# Patient Record
Sex: Male | Born: 1980 | Race: Black or African American | Hispanic: No | Marital: Married | Smoking: Current every day smoker
Health system: Southern US, Community
[De-identification: ages and names within clinical notes are randomized; demographics above are authoritative.]

## PROBLEM LIST (undated history)

## (undated) DIAGNOSIS — R519 Headache, unspecified: Secondary | ICD-10-CM

## (undated) HISTORY — DX: Headache, unspecified: R51.9

---

## 2010-07-04 ENCOUNTER — Emergency Department (HOSPITAL_COMMUNITY)
Admission: EM | Admit: 2010-07-04 | Discharge: 2010-07-04 | Payer: Self-pay | Source: Home / Self Care | Admitting: Emergency Medicine

## 2016-08-27 ENCOUNTER — Encounter (HOSPITAL_COMMUNITY): Payer: Self-pay | Admitting: Emergency Medicine

## 2016-08-27 ENCOUNTER — Emergency Department (HOSPITAL_COMMUNITY)
Admission: EM | Admit: 2016-08-27 | Discharge: 2016-08-27 | Disposition: A | Discharge status: Home / Self Care | Payer: Self-pay | Attending: Emergency Medicine | Admitting: Emergency Medicine

## 2016-08-27 DIAGNOSIS — Y990 Civilian activity done for income or pay: Secondary | ICD-10-CM | POA: Insufficient documentation

## 2016-08-27 DIAGNOSIS — M545 Low back pain, unspecified: Secondary | ICD-10-CM

## 2016-08-27 DIAGNOSIS — Y929 Unspecified place or not applicable: Secondary | ICD-10-CM | POA: Insufficient documentation

## 2016-08-27 DIAGNOSIS — Y939 Activity, unspecified: Secondary | ICD-10-CM | POA: Insufficient documentation

## 2016-08-27 DIAGNOSIS — X500XXA Overexertion from strenuous movement or load, initial encounter: Secondary | ICD-10-CM | POA: Insufficient documentation

## 2016-08-27 DIAGNOSIS — F17200 Nicotine dependence, unspecified, uncomplicated: Secondary | ICD-10-CM | POA: Insufficient documentation

## 2016-08-27 MED ORDER — CYCLOBENZAPRINE HCL 10 MG PO TABS: 10.0000 mg | ORAL_TABLET | Freq: Two times a day (BID) | ORAL | 0 refills | Status: DC | PRN

## 2016-08-27 NOTE — ED Provider Notes (Signed)
[facility] Provider Note   CSN: [id number] Arrival date & time: 08/27/16  1128     History   Chief Complaint Chief Complaint  Patient presents with  . Back Pain    HPI Brian Kane is a 36 y.o. male.  HPI   Residual male presents to complaints of back pain. Patient reports several years ago after lifting weights he had lower back pain that resolved without intervention. Patient notes that he works at [employer] which requires him to lift heavy objects on a regular basis. Patient notes that over the last week he's had lower back pain bilateral in the lumbar region. He denies any acute episode causing today's symptoms. He denies any involvement of the distal extremities, no red flags. Patient has not been using any pain medication at home.  History reviewed. No pertinent past medical history.  There are no active problems to display for this patient.   History reviewed. No pertinent surgical history.     Home Medications    Prior to Admission medications   Medication Sig Start Date End Date Taking? Authorizing Provider  cyclobenzaprine (FLEXERIL) 10 MG tablet Take 1 tablet (10 mg total) by mouth 2 (two) times daily as needed for muscle spasms. 08/27/16   Eyvonne MechanicJeffrey Khyli Swaim, PA-C    Family History No family history on file.  Social History Social History  Substance Use Topics  . Smoking status: Current Some Day Smoker  . Smokeless tobacco: Not on file  . Alcohol use Yes     Allergies   Patient has no known allergies.   Review of Systems Review of Systems  All other systems reviewed and are negative.    Physical Exam Updated Vital Signs BP 126/90 (BP Location: Right Arm)   Pulse 81   Temp 98.4 F (36.9 C) (Oral)   Resp 18   SpO2 100%   Physical Exam  Constitutional: He is oriented to person, place, and time. He appears well-developed and well-nourished. No distress.  HENT:  Head: Normocephalic.  Neck: Normal range of motion. Neck  supple.  Pulmonary/Chest: Effort normal.  Musculoskeletal: Normal range of motion. He exhibits tenderness. He exhibits no edema.  No C, T, or L spine tenderness to palpation. No obvious signs of trauma, deformity, infection, step-offs. Lung expansion normal. No scoliosis or kyphosis. Bilateral lower extremity strength 5 out of 5, sensation grossly intact,Straight leg negative  Moderate tenderness to palpation of the bilateral lumbar soft tissues  Ambulates without difficulty   Neurological: He is alert and oriented to person, place, and time.  Skin: Skin is warm and dry. He is not diaphoretic.  Psychiatric: He has a normal mood and affect. His behavior is normal. Judgment and thought content normal.  Nursing note and vitals reviewed.    ED Treatments / Results  Labs (all labs ordered are listed, but only abnormal results are displayed) Labs Reviewed - No data to display  EKG  EKG Interpretation None       Radiology No results found.  Procedures Procedures (including critical care time)  Medications Ordered in ED Medications - No data to display   Initial Impression / Assessment and Plan / ED Course  I have reviewed the triage vital signs and the nursing notes.  Pertinent labs & imaging results that were available during my care of the patient were reviewed by me and considered in my medical decision making (see chart for details).     36 year old male presents today with some complaints of back  pain. He will be encouraged to use ibuprofen, ice, rest. Follow-up with orthopedics if symptoms persist beyond 2 weeks, return to the emergency room if he has any new or worsening symptoms. Patient verbalized understanding and agreement to today's plan had no further questions or concerns   Final Clinical Impressions(s) / ED Diagnoses   Final diagnoses:  Acute bilateral low back pain without sciatica    New Prescriptions New Prescriptions   CYCLOBENZAPRINE (FLEXERIL) 10  MG TABLET    Take 1 tablet (10 mg total) by mouth 2 (two) times daily as needed for muscle spasms.     Eyvonne Mechanic, PA-C 08/27/16 1358    Mancel Bale, MD 08/27/16 1710

## 2016-08-27 NOTE — Discharge Instructions (Signed)
Please read attached information. If you experience any new or worsening signs or symptoms please return to the emergency room for evaluation. Please follow-up with your primary care provider or specialist as discussed. Please use medication prescribed only as directed and discontinue taking if you have any concerning signs or symptoms.   °

## 2016-08-27 NOTE — ED Notes (Signed)
Patient waiting for Dr.

## 2016-08-27 NOTE — ED Notes (Signed)
Pt unable to sign for discharge.  Pad not working. Verbalized understanding of instructions.

## 2016-08-27 NOTE — ED Triage Notes (Signed)
Pt c/o lower back pain x1 week, states 10 years ago he had a football injury to same area. States it hurts worse with exertion and bending over. Pt ambulatory.

## 2016-08-27 NOTE — ED Notes (Signed)
Pt c/o pain to mid, lower back for approx one week.  Denies any specific injury.  Pain does not radiate down legs, concerned because it is difficult for him to stand and walk due to the pain.  In no apparent distress.

## 2016-08-31 ENCOUNTER — Emergency Department (HOSPITAL_COMMUNITY)
Admission: EM | Admit: 2016-08-31 | Discharge: 2016-08-31 | Disposition: A | Discharge status: Home / Self Care | Payer: Self-pay | Attending: Emergency Medicine | Admitting: Emergency Medicine

## 2016-08-31 ENCOUNTER — Encounter (HOSPITAL_COMMUNITY): Payer: Self-pay

## 2016-08-31 DIAGNOSIS — X501XXA Overexertion from prolonged static or awkward postures, initial encounter: Secondary | ICD-10-CM | POA: Insufficient documentation

## 2016-08-31 DIAGNOSIS — Y929 Unspecified place or not applicable: Secondary | ICD-10-CM | POA: Insufficient documentation

## 2016-08-31 DIAGNOSIS — Y9389 Activity, other specified: Secondary | ICD-10-CM | POA: Insufficient documentation

## 2016-08-31 DIAGNOSIS — M545 Low back pain, unspecified: Secondary | ICD-10-CM

## 2016-08-31 DIAGNOSIS — Y990 Civilian activity done for income or pay: Secondary | ICD-10-CM | POA: Insufficient documentation

## 2016-08-31 DIAGNOSIS — F17200 Nicotine dependence, unspecified, uncomplicated: Secondary | ICD-10-CM | POA: Insufficient documentation

## 2016-08-31 MED ORDER — PREDNISONE 20 MG PO TABS: 40.0000 mg | ORAL_TABLET | Freq: Every day | ORAL | 0 refills | Status: DC

## 2016-08-31 NOTE — ED Provider Notes (Signed)
[facility] Provider Note   CSN: [id number] Arrival date & time: 08/31/16  1300   By signing my name below, I, Teofilo Pod, attest that this documentation has been prepared under the direction and in the presence of Roxy Horseman, PA-C. Electronically Signed: Teofilo Pod, ED Scribe. 08/31/2016. 2:46 PM.   History   Chief Complaint Chief Complaint  Patient presents with  . Back Pain    The history is provided by the patient. No language interpreter was used.   HPI Comments:  Brian Kane is a 36 y.o. male who presents to the Emergency Department complaining of constant lower back pain x ~2 weeks. Pt was seen here for the same 5 days ago and was prescribed flexeril that he states has not provided adequate relief. Pt frequently lifts heavy objects at work. Pt denies IV drug use. No alleviating factors noted. Pt denies bowel/bladder incontinence, fever chills. Denies any weakness, numbness, or tingling.  History reviewed. No pertinent past medical history.  There are no active problems to display for this patient.   History reviewed. No pertinent surgical history.     Home Medications    Prior to Admission medications   Medication Sig Start Date End Date Taking? Authorizing Provider  cyclobenzaprine (FLEXERIL) 10 MG tablet Take 1 tablet (10 mg total) by mouth 2 (two) times daily as needed for muscle spasms. 08/27/16   Eyvonne Mechanic, PA-C    Family History History reviewed. No pertinent family history.  Social History Social History  Substance Use Topics  . Smoking status: Current Some Day Smoker  . Smokeless tobacco: Never Used  . Alcohol use Yes     Allergies   Patient has no known allergies.   Review of Systems Review of Systems  Constitutional: Negative for chills and fever.  Musculoskeletal: Positive for back pain.     Physical Exam Updated Vital Signs BP (!) 146/105 (BP Location: Left Arm)   Pulse 83   Temp 98.9 F  (37.2 C) (Oral)   Resp 18   SpO2 100%   Physical Exam Physical Exam  Constitutional: Pt appears well-developed and well-nourished. No distress.  HENT:  Head: Normocephalic and atraumatic.  Mouth/Throat: Oropharynx is clear and moist. No oropharyngeal exudate.  Eyes: Conjunctivae are normal.  Neck: Normal range of motion. Neck supple.  No meningismus Cardiovascular: Normal rate, regular rhythm and intact distal pulses.   Pulmonary/Chest: Effort normal and breath sounds normal. No respiratory distress. Pt has no wheezes.  Abdominal: Pt exhibits no distension Musculoskeletal:  Bilateral lumbar paraspinal muscles tender to palpation, no bony CTLS spine tenderness, deformity, step-off, or crepitus Lymphadenopathy: Pt has no cervical adenopathy.  Neurological: Pt is alert and oriented Speech is clear and goal oriented, follows commands Normal 5/5 strength in upper and lower extremities bilaterally including dorsiflexion and plantar flexion, strong and equal grip strength Sensation intact Great toe extension intact Moves extremities without ataxia, coordination intact Normal gait Normal balance No Clonus Skin: Skin is warm and dry. No rash noted. Pt is not diaphoretic. No erythema.  Psychiatric: Pt has a normal mood and affect. Behavior is normal.  Nursing note and vitals reviewed.   ED Treatments / Results  DIAGNOSTIC STUDIES:  Oxygen Saturation is 100% on RA, normal by my interpretation.    COORDINATION OF CARE:  2:46 PM Will prescribe prednisone and will refer to specialist if pain continues. Discussed treatment plan with pt at bedside and pt agreed to plan.   Labs (all labs ordered are  listed, but only abnormal results are displayed) Labs Reviewed - No data to display  EKG  EKG Interpretation None       Radiology No results found.  Procedures Procedures (including critical care time)  Medications Ordered in ED Medications - No data to display   Initial  Impression / Assessment and Plan / ED Course  I have reviewed the triage vital signs and the nursing notes.  Pertinent labs & imaging results that were available during my care of the patient were reviewed by me and considered in my medical decision making (see chart for details).     Patient with back pain.  No neurological deficits and normal neuro exam.  Patient is ambulatory.  No loss of bowel or bladder control.  Doubt cauda equina.  Denies fever,  doubt epidural abscess or other lesion. Recommend back exercises, stretching, RICE, and will treat with a short course of prednisone.  Encouraged the patient that there could be a need for additional workup and/or imaging such as MRI, if the symptoms do not resolve. Patient advised that if the back pain does not resolve, or radiates, this could progress to more serious conditions and is encouraged to follow-up with PCP or orthopedics within 2 weeks.     Final Clinical Impressions(s) / ED Diagnoses   Final diagnoses:  Acute bilateral low back pain without sciatica    New Prescriptions New Prescriptions   PREDNISONE (DELTASONE) 20 MG TABLET    Take 2 tablets (40 mg total) by mouth daily.  I personally performed the services described in this documentation, which was scribed in my presence. The recorded information has been reviewed and is accurate.       Roxy HorsemanRobert Adaijah Endres, PA-C 08/31/16 1503    Lavera Guiseana Duo Liu, MD 09/01/16 [phone]

## 2016-08-31 NOTE — ED Triage Notes (Signed)
Pt reports lower back pain. He has been seen for same but reports he is still having pain. He reports the pain is worse after sitting for long periods. Pt ambulatory without difficulty.

## 2017-09-18 ENCOUNTER — Ambulatory Visit (HOSPITAL_COMMUNITY): Payer: Self-pay | Admitting: Psychology

## 2017-09-18 ENCOUNTER — Ambulatory Visit (HOSPITAL_COMMUNITY)
Admission: RE | Admit: 2017-09-18 | Discharge: 2017-09-18 | Disposition: A | Discharge status: Home / Self Care | Payer: 59 | Attending: Psychiatry | Admitting: Psychiatry

## 2017-09-18 ENCOUNTER — Telehealth (HOSPITAL_COMMUNITY): Payer: Self-pay | Admitting: Psychology

## 2017-09-18 NOTE — [facility] Assessment (Signed)
[facility] Assessment Progress Note  Pt came in as a [facility] walk in for detox at 1600. After waiting in the lobby for awhile, pt realized that [facility].   Brian ShockSamantha M. Ladona Ridgelaylor, MS, NCC, LPCA Counselor

## 2017-09-19 ENCOUNTER — Ambulatory Visit (INDEPENDENT_AMBULATORY_CARE_PROVIDER_SITE_OTHER): Payer: 59 | Admitting: Psychology

## 2017-09-19 ENCOUNTER — Encounter (HOSPITAL_COMMUNITY): Payer: Self-pay | Admitting: Psychology

## 2017-09-19 DIAGNOSIS — F1020 Alcohol dependence, uncomplicated: Secondary | ICD-10-CM
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
## 2017-10-06 ENCOUNTER — Encounter (HOSPITAL_COMMUNITY): Payer: Self-pay | Admitting: Psychology
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
## 2017-10-07 NOTE — Addendum Note (Signed)
Addended byLogan Bores: Mailani Degroote on: 10/07/2017 02:44 PM   Modules accepted: Level of Service

## 2017-10-09 ENCOUNTER — Other Ambulatory Visit (HOSPITAL_COMMUNITY): Payer: 59 | Attending: Psychiatry | Admitting: Psychology

## 2017-10-09 ENCOUNTER — Encounter (HOSPITAL_COMMUNITY): Payer: Self-pay | Admitting: Medical

## 2017-10-09 VITALS — BP 132/90 | HR 81 | Ht 72.0 in | Wt 261.6 lb

## 2017-10-09 DIAGNOSIS — F1211 Cannabis abuse, in remission: Secondary | ICD-10-CM

## 2017-10-09 DIAGNOSIS — Z62812 Personal history of neglect in childhood: Secondary | ICD-10-CM

## 2017-10-09 DIAGNOSIS — F4312 Post-traumatic stress disorder, chronic: Secondary | ICD-10-CM

## 2017-10-09 DIAGNOSIS — F1020 Alcohol dependence, uncomplicated: Secondary | ICD-10-CM

## 2017-10-09 DIAGNOSIS — Z811 Family history of alcohol abuse and dependence: Secondary | ICD-10-CM | POA: Insufficient documentation

## 2017-10-09 DIAGNOSIS — G44011 Episodic cluster headache, intractable: Secondary | ICD-10-CM

## 2017-10-09 DIAGNOSIS — Z6372 Alcoholism and drug addiction in family: Secondary | ICD-10-CM | POA: Insufficient documentation

## 2017-10-09 DIAGNOSIS — Z9189 Other specified personal risk factors, not elsewhere classified: Secondary | ICD-10-CM

## 2017-10-09 DIAGNOSIS — G44001 Cluster headache syndrome, unspecified, intractable: Secondary | ICD-10-CM | POA: Insufficient documentation

## 2017-10-09 MED ORDER — BACLOFEN 10 MG PO TABS: 10.0000 mg | ORAL_TABLET | Freq: Three times a day (TID) | ORAL | 11 refills | Status: AC

## 2017-10-09 MED ORDER — SERTRALINE HCL 25 MG PO TABS: ORAL_TABLET | ORAL | 0 refills | Status: DC

## 2017-10-09 MED ORDER — B COMPLEX VITAMINS PO CAPS
1.0000 | ORAL_CAPSULE | Freq: Every day | ORAL | 2 refills | Status: AC
Start: 2017-10-09 — End: 2018-01-07
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
## 2017-10-11 ENCOUNTER — Other Ambulatory Visit (HOSPITAL_COMMUNITY): Payer: 59

## 2017-10-11 ENCOUNTER — Encounter (HOSPITAL_COMMUNITY): Payer: Self-pay
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
## 2017-10-12 ENCOUNTER — Other Ambulatory Visit (HOSPITAL_COMMUNITY): Payer: 59

## 2017-10-15 ENCOUNTER — Telehealth (HOSPITAL_COMMUNITY): Payer: Self-pay | Admitting: Psychology

## 2017-10-16 ENCOUNTER — Other Ambulatory Visit (HOSPITAL_COMMUNITY): Payer: 59 | Attending: Psychiatry | Admitting: Psychology

## 2017-10-16 ENCOUNTER — Telehealth (HOSPITAL_COMMUNITY): Payer: Self-pay | Admitting: Psychology

## 2017-10-16 DIAGNOSIS — F4312 Post-traumatic stress disorder, chronic: Secondary | ICD-10-CM | POA: Diagnosis present

## 2017-10-16 DIAGNOSIS — Z811 Family history of alcohol abuse and dependence: Secondary | ICD-10-CM | POA: Insufficient documentation

## 2017-10-16 DIAGNOSIS — F1020 Alcohol dependence, uncomplicated: Secondary | ICD-10-CM | POA: Insufficient documentation

## 2017-10-16 DIAGNOSIS — G44001 Cluster headache syndrome, unspecified, intractable: Secondary | ICD-10-CM | POA: Diagnosis not present

## 2017-10-16 DIAGNOSIS — F1211 Cannabis abuse, in remission: Secondary | ICD-10-CM | POA: Diagnosis not present

## 2017-10-17 ENCOUNTER — Encounter (HOSPITAL_COMMUNITY): Payer: Self-pay | Admitting: Psychology

## 2017-10-17 NOTE — Progress Notes (Signed)
    Daily Group Progress Note  Program: CD-IOP   10/17/2017 Brian Kane [id number]  Diagnosis:  Alcohol use disorder, severe, dependence ([facility])   Sobriety Date: 3/11  Group Time: 1-2:30  Participation Level: Active  Behavioral Response: Appropriate and Sharing  Type of Therapy: Process Group  Interventions: CBT, Motivational Interviewing, Strength-based and Supportive  Topic: Patients were active and engaged in process session. Counselors led pt's in discussion concerning their recovery from mind-altering drugs. Emphasis on goals from tx plan and thoughts and feelings processing. UDS samples were collected from some members. One returning member was back after missing a group due to serving some jail time. One member graduated successfully and was able to listen to group feedback w/o using humor as a defense mechanism for avoiding feelings of self-worth.      Group Time: 2:30-4  Participation Level: Active  Behavioral Response: Appropriate and Sharing  Type of Therapy: Psycho-education Group  Interventions: Other: Chair Yoga  Topic: Patients were active and engaged in group psychoeducation session. A guest counselor, Forde RadonLeanne Yates [facility] was present and led 1.5 hr Chair Yoga routine for increased peace and mindfulness. Pts were attentive and shared openly during session. Pts were invited to share about their experience of the yoga routine w/ the group.     Summary: Pt active and engaged in session. Pt was reminded that he needs to establish better connection w/ counselors and this program since there were multiple attempts to contact him this past weekend, w/o a response. Pt apologized and stated he is planning on continuing in the program, though he will have to serve jail time 15 weekends in a row starting this weekend. Pt was emotionless when disclosing this to group. The group encouraged him to focus on his sobriety in the midst of the struggle. Pt reported he has  been focused on moving into a new house by himself. Pt reported he struggled w/ depression this weekend which caused him to crave alcohol. Pt stated he did get his medications filled and has begun taking them as px. He was active and engaged during yoga session though he admitted he had never done any yoga before. Pt stated he felt more relaxed after yoga session. A UDS sample was collected from member. Pt reported he did not attend any 12 step meetings over weekend.   UDS collected: Yes Results: pending  AA/NA attended?: No  Sponsor?: No   Brian Kane, LPCA LCASA 10/17/2017 5:18 PM

## 2017-10-18 ENCOUNTER — Other Ambulatory Visit (INDEPENDENT_AMBULATORY_CARE_PROVIDER_SITE_OTHER): Payer: 59 | Admitting: Psychology

## 2017-10-18 DIAGNOSIS — F4312 Post-traumatic stress disorder, chronic: Secondary | ICD-10-CM | POA: Diagnosis not present

## 2017-10-18 DIAGNOSIS — Z6372 Alcoholism and drug addiction in family: Secondary | ICD-10-CM

## 2017-10-18 DIAGNOSIS — G44011 Episodic cluster headache, intractable: Secondary | ICD-10-CM

## 2017-10-18 DIAGNOSIS — Z62812 Personal history of neglect in childhood: Secondary | ICD-10-CM

## 2017-10-18 DIAGNOSIS — F1020 Alcohol dependence, uncomplicated: Secondary | ICD-10-CM | POA: Diagnosis not present

## 2017-10-18 DIAGNOSIS — Z9189 Other specified personal risk factors, not elsewhere classified: Secondary | ICD-10-CM | POA: Diagnosis not present

## 2017-10-18 DIAGNOSIS — F1211 Cannabis abuse, in remission: Secondary | ICD-10-CM

## 2017-10-18 DIAGNOSIS — Z811 Family history of alcohol abuse and dependence: Secondary | ICD-10-CM
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
## 2017-10-19 ENCOUNTER — Other Ambulatory Visit (HOSPITAL_COMMUNITY): Payer: 59 | Admitting: Psychology

## 2017-10-19 ENCOUNTER — Encounter (HOSPITAL_COMMUNITY): Payer: Self-pay | Admitting: Medical

## 2017-10-19 DIAGNOSIS — F1020 Alcohol dependence, uncomplicated: Secondary | ICD-10-CM

## 2017-10-19 DIAGNOSIS — F4312 Post-traumatic stress disorder, chronic: Secondary | ICD-10-CM
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
## 2017-10-23 ENCOUNTER — Telehealth (HOSPITAL_COMMUNITY): Payer: Self-pay | Admitting: Psychology

## 2017-10-23 ENCOUNTER — Other Ambulatory Visit (HOSPITAL_COMMUNITY): Payer: 59

## 2017-10-25 ENCOUNTER — Other Ambulatory Visit (INDEPENDENT_AMBULATORY_CARE_PROVIDER_SITE_OTHER): Payer: 59 | Admitting: Psychology

## 2017-10-25 ENCOUNTER — Encounter (HOSPITAL_COMMUNITY): Payer: Self-pay | Admitting: Medical

## 2017-10-25 DIAGNOSIS — G44011 Episodic cluster headache, intractable: Secondary | ICD-10-CM | POA: Diagnosis not present

## 2017-10-25 DIAGNOSIS — Z62812 Personal history of neglect in childhood: Secondary | ICD-10-CM | POA: Diagnosis not present

## 2017-10-25 DIAGNOSIS — Z9189 Other specified personal risk factors, not elsewhere classified: Secondary | ICD-10-CM

## 2017-10-25 DIAGNOSIS — F1020 Alcohol dependence, uncomplicated: Secondary | ICD-10-CM | POA: Diagnosis not present

## 2017-10-25 DIAGNOSIS — Z6372 Alcoholism and drug addiction in family: Secondary | ICD-10-CM

## 2017-10-25 DIAGNOSIS — F4312 Post-traumatic stress disorder, chronic: Secondary | ICD-10-CM

## 2017-10-25 DIAGNOSIS — Z9119 Patient's noncompliance with other medical treatment and regimen: Secondary | ICD-10-CM | POA: Diagnosis not present

## 2017-10-25 DIAGNOSIS — Z91199 Patient's noncompliance with other medical treatment and regimen due to unspecified reason: Secondary | ICD-10-CM

## 2017-10-25 DIAGNOSIS — F1211 Cannabis abuse, in remission: Secondary | ICD-10-CM | POA: Diagnosis not present

## 2017-10-25 DIAGNOSIS — Z811 Family history of alcohol abuse and dependence: Secondary | ICD-10-CM

## 2017-10-25 MED ORDER — TRAZODONE HCL 50 MG PO TABS: 50.0000 mg | ORAL_TABLET | Freq: Every evening | ORAL | 2 refills | Status: DC | PRN

## 2017-10-25 NOTE — Progress Notes (Addendum)
Patient ID: Brian Kane, male   DOB: 01/22/1981, 37 y.o.   MRN: [id number] S&O Pt seen after failure to appear for 2 groups and failure to call and UDS + for alcohol 10/16/2017.[name]. At team meeting it was agreed to discharge patient to higher level of care. [name] demonstrates remorse for his failure to act responsibly at [age] yrs of age. His use of alcohol was triggered by discovery of some White liquor while cleaning and packing to move.  His absences were the result of his legal entanglements around his DUI s.he had agreed to serve weekends and upon release from weekend jail he noticed the charge on his paperwork was not what he had agreed to The charf ge was more serios and required 1-2 yr incarceration. He spent the next few days going between Court and Lawyer-obsessed with fear and trying to get the charge reduced on his paper work.   He repeats "I 've got a lot on my plate" and then reveals his relationship is a source of stress and "makes me want to drink" .[name]. He says he needs to end it .  Requests Trazodone  A- Naive to treatment and just beginning first attempt-failures do not indicate lack of motivation for treatment but rather lack of skills and understanding  P- Pt is informed that he will not be discharged but that he is at "Zero tolerance" for repeat absences and use. He is given a slogan "First Things First" to use when he feels overwhelmed to help keep priorities straight. He is reminded of mindfulness principle that h is alright in the present moment and can focus his attention there rather than on worries he can do nothing about right now. He is instructed to do what he can now/today about his situation and then to"let go" of what he cannot. He is asked to compare 8 weekends (16 days) to a lifetime of addiction and its consequences.he is informed that what becomes more important than his sobriety/recovery will be the first thing he will lose to his addiction/relapse Finally he is  asked to pray about these things and returned to Group

## 2017-10-25 NOTE — Addendum Note (Signed)
Addended by: Court JoyKOBER, Jevon Shells E on: 10/25/2017 04:07 PM   Modules accepted: Orders

## 2017-10-26 ENCOUNTER — Other Ambulatory Visit (HOSPITAL_COMMUNITY): Payer: 59 | Admitting: Psychology

## 2017-10-26 DIAGNOSIS — F1020 Alcohol dependence, uncomplicated: Secondary | ICD-10-CM

## 2017-10-27 ENCOUNTER — Encounter (HOSPITAL_COMMUNITY): Payer: Self-pay | Admitting: Psychology

## 2017-10-27 NOTE — Progress Notes (Signed)
    Daily Group Progress Note  Program: CD-IOP   10/27/2017 Brian Kane [id number]  Diagnosis: F10.20  Sobriety Date: 10/14/2017  Group Time: 1-2:30pm  Participation Level: Active  Behavioral Response: Appropriate  Type of Therapy: Process Group  Interventions: Supportive  Topic: Patients were active and engaged in process session.  Patients shared about coping skills that they have been using to combat triggers and thoughts of using.  Patients shared about their experiences in 12-step recovery meetings with group members who have yet to attend meetings yet.  Patients drew popsicle sticks with different recovery-related topics written on them and spoke about how they could relate to their chosen topic.  Patients welcomed a new group member.  Counselors collected three UDS.   Group Time: 2:30-4pm  Participation Level: Active  Behavioral Response: Appropriate  Type of Therapy: Psycho-education Group  Interventions: Strength-based and Psychologist, occupationalocial Skills Training  Topic: Patients were active and engaged in psychoeducation session, during which counselors led education and discussion around [employer]'s "Five Love Languages".  The Five Languages are five ways that people tend to give/receive love and include words of affirmation, receiving gifts, acts of service, physical touch and quality time.  Patients took an assessment to determine how much they value each love language.  Patients were then encouraged to think about how they prefer to experience love and how their loved ones' love languages may be similar or different from their own.  Patients responded well to psychoeducation and engaged in active discussion.   Summary: Patient was engaged in session.  Patient said he did a "self-check" after group the previous day and recognized that he needs to be honest with himself and others.  Patient went to the 10 AM men's AA meeting that morning and felt welcomed and received a  starter chip.  Patient inquired about what it takes to become a home group member in [place] and said he received about 15 phone numbers from other men in the fellowship.  Patient said he has to write a 3-page paper before he goes back to jail this weekend and has set up a meeting with [employer].  In the popsicle stick activity, patient spoke about "service in the fellowship" and expressed that he looks forward to attending more AA meetings and becoming more involved.  Patient identified words of affirmation and quality time as his top two love languages.   UDS collected: No  AA/NA attended?: YesThursday  Sponsor?: No   Charmian Muffnn Tiffney Haughton, LCAS 10/27/2017 3:03 PM

## 2017-10-27 NOTE — Progress Notes (Signed)
    Daily Group Progress Note  Program: CD-IOP  10/19/2017 Ermelinda Dasmanuel R Gwyn [id number]  Diagnosis: F10.20  Sobriety Date: 09/25/2017  Group Time: 1-2:30pm  Participation Level: Active  Behavioral Response: Appropriate  Type of Therapy: Process Group  Interventions: Supportive  Topic: Patients were active and engaged in process session.  Patients shared accomplishments and challenges with relationships and recovery.  Patients shared about their experiences of recovery meetings and the progression of addiction.  Patients provided one another with feedback surrounding the course of addiction and how they have come to identify themselves as addicts or alcoholics.   Group Time: 2:30-4pm  Participation Level: Active  Behavioral Response: Appropriate  Type of Therapy: Psycho-education Group  Interventions: Family Systems  Topic: Patients were active and engaged in psychoeducation session, in which counselors facilitated continued discussion around family through family sculpting.  Several more patients sculpted their families, using other group members to represent their family members, including themselves.  Patients responded well to psychoeducation and engaged in active discussion.   Summary: Patient was engaged and quiet in session.  Patient said he wanted to go to a meeting the previous day but had no way to get there, because he had cash but no credit on his account to use [employer].  Patient said he thought about illegally driving there but decided against it, as he saw a Parole Officer's car.  Patient shared about his upcoming move and said he spent time working on his mower.  Patient related to other group members who felt like they were supposed to be the "man of the house" when their fathers left.   UDS collected: No   AA/NA attended?: No  Sponsor?: No   Charmian Muffnn Jhalil Silvera, LCAS 10/27/2017 2:32 PM

## 2017-10-30 ENCOUNTER — Other Ambulatory Visit (HOSPITAL_COMMUNITY): Payer: 59 | Admitting: Psychology

## 2017-10-30 ENCOUNTER — Encounter (HOSPITAL_COMMUNITY): Payer: Self-pay | Admitting: Psychology

## 2017-10-30 DIAGNOSIS — F1020 Alcohol dependence, uncomplicated: Secondary | ICD-10-CM
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
## 2017-10-31 ENCOUNTER — Encounter (HOSPITAL_COMMUNITY): Payer: Self-pay | Admitting: Psychology
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
## 2017-11-01 ENCOUNTER — Other Ambulatory Visit (HOSPITAL_COMMUNITY): Payer: 59 | Admitting: Psychology

## 2017-11-01 DIAGNOSIS — F1020 Alcohol dependence, uncomplicated: Secondary | ICD-10-CM | POA: Diagnosis not present

## 2017-11-02 ENCOUNTER — Other Ambulatory Visit (HOSPITAL_COMMUNITY): Payer: 59 | Admitting: Psychology

## 2017-11-02 DIAGNOSIS — F1020 Alcohol dependence, uncomplicated: Secondary | ICD-10-CM

## 2017-11-02 DIAGNOSIS — F4312 Post-traumatic stress disorder, chronic: Secondary | ICD-10-CM

## 2017-11-06 ENCOUNTER — Other Ambulatory Visit (HOSPITAL_COMMUNITY): Payer: 59 | Admitting: Psychology

## 2017-11-06 ENCOUNTER — Other Ambulatory Visit (HOSPITAL_COMMUNITY): Payer: Self-pay | Admitting: Medical

## 2017-11-06 ENCOUNTER — Encounter (HOSPITAL_COMMUNITY): Payer: Self-pay | Admitting: Medical

## 2017-11-06 DIAGNOSIS — F1211 Cannabis abuse, in remission: Secondary | ICD-10-CM

## 2017-11-06 DIAGNOSIS — Z62812 Personal history of neglect in childhood: Secondary | ICD-10-CM

## 2017-11-06 DIAGNOSIS — Z9189 Other specified personal risk factors, not elsewhere classified: Secondary | ICD-10-CM

## 2017-11-06 DIAGNOSIS — F1020 Alcohol dependence, uncomplicated: Secondary | ICD-10-CM | POA: Diagnosis not present

## 2017-11-06 DIAGNOSIS — F4312 Post-traumatic stress disorder, chronic: Secondary | ICD-10-CM

## 2017-11-06 DIAGNOSIS — G44011 Episodic cluster headache, intractable: Secondary | ICD-10-CM

## 2017-11-06 DIAGNOSIS — Z6372 Alcoholism and drug addiction in family: Secondary | ICD-10-CM

## 2017-11-06 DIAGNOSIS — Z811 Family history of alcohol abuse and dependence: Secondary | ICD-10-CM

## 2017-11-06 MED ORDER — SERTRALINE HCL 100 MG PO TABS: ORAL_TABLET | ORAL | 1 refills | Status: DC
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
## 2017-11-07 ENCOUNTER — Encounter (HOSPITAL_COMMUNITY): Payer: Self-pay | Admitting: Psychology

## 2017-11-07 NOTE — Progress Notes (Signed)
    Daily Group Progress Note  Program: CD-IOP   11/01/2017 Brian Kane [id number]  Diagnosis: F10.20  Sobriety Date: 3/30  Group Time: 1-2:30pm  Participation Level: Active  Behavioral Response: Appropriate  Type of Therapy: Process Group  Interventions: Supportive  Topic: Patients were active and engaged in process session.  Patients shared about challenges and successes in recovery and relationships.  Patients provided one another with feedback and shared common experiences.  Patients discussed recovery-based meetings, triggers and ways that they have developed routines to promote their sobriety and recovery.  Medical director met with several patients.   Group Time: 2:30-4pm  Participation Level: Active  Behavioral Response: Appropriate  Type of Therapy: Psycho-education Group  Interventions: Assertiveness Training  Topic: Patients were active and engaged in psychoeducation session, during which counselors continued leading discussion around the topic of communication.  Patients identified ways that they had communicated throughout the week and what type of communication they used (passive, aggressive passive-aggressive, or assertive).  Counselors taught more about assertive communication and how to use the pattern "When you _____, I feel ______" to communicate effectively and take responsibility for one's feelings.  Patients engaged in role plays and discussed current conflicts in their lives, while using the "Feelings Wheel" to name emotions that they were experiencing.  Patients responded well to psychoeducation and engaged in active discussion.   Summary: Patient was engaged in session.  Patient said he helped his mother move into her own place on Monday and went to court on Tuesday.  Patient said he will hopefully be able to choose the weekends that he serves his jail time, if he works it out with his Engineer, manufacturing systems.  Patient said he called around 34  lawyers to try to have someone to represent him in court that day and ended up running into a lawyer and making a deal on the spot.  Patient said he emailed Clinical biochemist of the [place] [employer] about his issue with his professor and was directed to email his professor again, so he is waiting to hear back.  Patient said he is waiting for his company to figure out his payroll, so that he can go for his DWI assessment.  Patient said he used aggressive communication on break during group, because his supervisor and his former supervisor contacted him about a work situation.  Patient said he felt angry, frustrated and furious after the call, because he felt that his coworkers were accusing him, which has happened before.  Counselors validated patient's feelings and that patient returned to group instead of showing up at his workplace angry.   UDS collected: No Results: negative  AA/NA attended?: Yes Wednesday  Sponsor?: No   [name], LCAS 11/07/2017 11:27 AM

## 2017-11-08 ENCOUNTER — Other Ambulatory Visit (HOSPITAL_COMMUNITY): Payer: 59 | Admitting: Psychology

## 2017-11-08 DIAGNOSIS — F1020 Alcohol dependence, uncomplicated: Secondary | ICD-10-CM

## 2017-11-09 ENCOUNTER — Other Ambulatory Visit (HOSPITAL_COMMUNITY): Payer: 59 | Admitting: Psychology

## 2017-11-13 ENCOUNTER — Encounter (HOSPITAL_COMMUNITY): Payer: Self-pay | Admitting: Psychology

## 2017-11-13 ENCOUNTER — Other Ambulatory Visit (HOSPITAL_COMMUNITY): Payer: 59 | Admitting: Psychology

## 2017-11-13 DIAGNOSIS — F1020 Alcohol dependence, uncomplicated: Secondary | ICD-10-CM

## 2017-11-13 NOTE — Progress Notes (Signed)
    Daily Group Progress Note  Program: CD-IOP   11/13/2017 Brian Kane [id number]  Diagnosis:  Alcohol use disorder, severe, dependence ([facility])  Chronic post-traumatic stress disorder (PTSD)   Sobriety Date:   Group Time: 1-2:30  Participation Level: Active  Behavioral Response: Appropriate and Sharing  Type of Therapy: Process Group  Interventions: CBT and Psychosocial Skills: ADL's and Sleep Hygiene  Topic: Patients were active and engaged in process session. Counselors led pt's in discussion concerning their recovery from mind-altering drugs. Emphasis on goals from tx plan and thoughts and feelings processing. 1 Pt graduated successfully and family memers were present for final 15 min of group. UDS results were returned to some members.      Group Time: 2:30-4  Participation Level: Active  Behavioral Response: Appropriate and Sharing  Type of Therapy: Psycho-education Group  Interventions: Psychosocial Skills: ADL's and Sleep Hygeine  Topic: Patients were active and engaged in group psychoeducation session. Duncan Dull, Wellness Director and health educator spoke to group for 1 hour on activities of daily living, w/ an emphasis on improving sleep. A powerpoint was presented. Pts asked questions and some asked for help on improving their sleep habits.     Summary: Pt is active and engaged in session. He reported he did not attend AA since yesterday. He shared openly about his problematic sleep. He denies active cravings or thoughts of using. Pt continues to report increased insight into his passive communication.    UDS collected: No Results: negative  AA/NA attended?: No  Sponsor?: No   Wes Hjalmar Ballengee, LPCA LCASA 11/13/2017 9:51 AM

## 2017-11-13 NOTE — Progress Notes (Signed)
    Daily Group Progress Note  Program: CD-IOP   11/13/2017 RUBERT FREDIANI [id number]  Diagnosis: F10.20 Alcohol use disorder, severe  Sobriety Date: 10/15/17  Group Time: 1-2:30pm  Participation Level: Active  Behavioral Response: Sharing  Type of Therapy: Process Group  Interventions: Supportive  Topic: Process: The first half of group was spent in process. Members shared the events of the past two days and identified any challenges or speed bumps that came their way. A new group member was present and he introduced himself during this half of group. Early in the session, one of the group members reported she was not feeling well. She was accompanied to the nurse's office and did not return to group. Three drug tests were collected. The medical director met with one member to review her discharge plan along with the new group member.  Group Time: 2:30-4pm  Participation Level: Active  Behavioral Response: Appropriate and Sharing  Type of Therapy: Psycho-education Group  Interventions: CBT  Topic: Chaplain: Feelings. The second half of group was spent in a psycho-ed with the visiting chaplain. After introductions, including having each member identify what they were feeling at that moment, the discussion focused on feelings. He explained a radical perspective that revolved around the notion that when we experience certain feelings, it is due largely to previous experiences and judgements and not necessarily to the immediate experience one is having. The idea that each of [place] is entirely responsible for what we are feeling was discussed at length. Members were invited to challenge old ways of thinking and embrace the possibilities of a different way of living.   Summary: The patient reported he had purchased a new lawn mower and completed three lawns yesterday. 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 He also put in a few applications. They were for a  position to maintain mechanical and industrial machinery. He reported he also attended his [employer] class last night. The semester is coming to an end and he is hoping he will pass the class. When asked about attending meetings, he admitted he had not gone to any since we last met. He was quick to point out that, "I have nothing against meetings, but I just haven't made time". The counselor reminded him that attending four recovery-based meetings per week is mandatory in order to be in this program. The patient assured her that he understood this. In the psycho-ed, the patient identified the feeling of 'accepted' and explained that he felt comforted knowing "I am not the only one dealing with this disease". He was attentive and engaged in the discussion on feelings and the concept that he is responsible for what he is feeling and no one else. The patient provided helpful feedback and he responded well to this intervention. The patient is a good group member and very quick to share his stuff, however, if he does not pick up his attendance in 12-step meetings, he will not be allowed to remain in the program.    UDS collected: No Results:   AA/NA attended?: No  Sponsor?: No   [name], [place] 11/13/2017 9:31 AM

## 2017-11-14 ENCOUNTER — Encounter (HOSPITAL_COMMUNITY): Payer: Self-pay | Admitting: Psychology
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
## 2017-11-15 ENCOUNTER — Encounter (HOSPITAL_COMMUNITY): Payer: Self-pay | Admitting: Psychology

## 2017-11-15 ENCOUNTER — Other Ambulatory Visit (HOSPITAL_COMMUNITY): Payer: 59 | Attending: Psychiatry | Admitting: Psychology

## 2017-11-15 DIAGNOSIS — F4312 Post-traumatic stress disorder, chronic: Secondary | ICD-10-CM | POA: Insufficient documentation

## 2017-11-15 DIAGNOSIS — F1020 Alcohol dependence, uncomplicated: Secondary | ICD-10-CM | POA: Insufficient documentation

## 2017-11-15 NOTE — Progress Notes (Signed)
    Daily Group Progress Note  Program: CD-IOP   11/15/2017 Brian Kane [id number]  Diagnosis: F10.20 Alcohol Use Disorder  Sobriety Date: 10/15/17  Group Time: 1-2:30pm  Participation Level: Active  Behavioral Response: Sharing  Type of Therapy: Process Group  Interventions: Supportive  Topic: Process: the first half of group was spent in process. Members shared about the past weekend. they identified 'speed bumps' or challenges as well as successes they may have had. A new group member was present today and she introduced herself during this half of group. The medical director met with a member for a med check. Three random drug tests were collected.  Group Time: 2:30-4pm  Participation Level: Active  Behavioral Response: Sharing  Type of Therapy: Psycho-education Group  Interventions: Psychosocial Skills: Change your thoughts, attitudes and behaviors  Topic: Psycho-Ed: Bio-Psycho-Social-Spiritual. The second half of group was a psycho-ed on the four major elements that contribute to the development of addiction. Group members were asked to identify examples of ways that their psychological, social and spiritual experiences now, and in childhood, may have contributed to their addiction. They were the asked to identify healthy ways that they can address these three elements that will enhance or promote sobriety. The session was lively with good disclosure and the newest group member seemed very comfortable and well-received.   Summary: The patient reported he had enjoyed the weekend and had not worked or gone to jail. He had spent time with a woman he has been seeing. He has been sharing what he has been learning in group with her and it is enhancing their relationship. They went bicycling at [facility] on Sunday and the patient admitted it was challenging and hard to bike up those trails. He was funny as he shared his experience of a '[employer]' bicycle compared to the  serious bikers that were flying past him. He realized he might need to get some new equipment. The patient reported his Engineer, manufacturing systems phoned him earlier today and stated he would be coming by for a home visit sometime this evening, between 5-9:30 pm. The patient didn't like this long period of waiting and expressed frustration over this. In the final part of group today, the patient identified his word as 'Gratitude'. He expressed that he was grateful for 'all life's lessons". He responded well to this intervention. A drug test was collected from this patient today.   UDS collected: Yes Results:pending  AA/NA attended?: YesMonday and Saturday  Sponsor?: No   Brian Kane, LCAS 11/15/2017 8:38 AM

## 2017-11-16 ENCOUNTER — Other Ambulatory Visit (HOSPITAL_COMMUNITY): Payer: 59 | Admitting: Psychology

## 2017-11-16 DIAGNOSIS — F4312 Post-traumatic stress disorder, chronic: Secondary | ICD-10-CM

## 2017-11-16 DIAGNOSIS — F1020 Alcohol dependence, uncomplicated: Secondary | ICD-10-CM

## 2017-11-20 ENCOUNTER — Encounter (HOSPITAL_COMMUNITY): Payer: Self-pay | Admitting: Psychology

## 2017-11-20 ENCOUNTER — Other Ambulatory Visit (HOSPITAL_COMMUNITY): Payer: 59 | Admitting: Psychology

## 2017-11-20 DIAGNOSIS — F4312 Post-traumatic stress disorder, chronic: Secondary | ICD-10-CM

## 2017-11-20 DIAGNOSIS — F1020 Alcohol dependence, uncomplicated: Secondary | ICD-10-CM

## 2017-11-20 NOTE — Progress Notes (Signed)
    Daily Group Progress Note  Program: CD-IOP   11/20/2017 HAYTHAM MAHER [id number]  Diagnosis: F10.20 Alcohol Use Disorder  Sobriety Date: 3/31  Group Time: 1-2:30pm  Participation Level: Active  Behavioral Response: Sharing  Type of Therapy: Process Group  Interventions: Supportive  Topic: Patients were active and engaged in process session.  Patients discussed the fifth step, within the context of the other 12 steps and shared thoughts on the weight and significance of this step.  Patients reflected upon the self-centeredness that often accompanies addiction.  The group celebrated a [employer] graduation and welcomed a new member.  Patients gained knowledge that new group member shared about what he has learned in recovery so far. Counselors collected two UDS.  Group Time: 2:30-4pm  Participation Level: Minimal  Behavioral Response: Appropriate  Type of Therapy: Psycho-education Group  Interventions: Being Vulnerable  Topic: Patients were active and engaged in psychoeducation session.  Counselor led a "loving-kindness meditation" and patients processed their reactions.  Counselors led education and discussion around vulnerability.  Patients watched a Ted Talk by Teresa Coombs, called "The Power of Vulnerability."  Patients reflected on how being vulnerable and recognizing one's self-worth can enhance quality of life.  Patients responded well to psychoeducation and engaged in active discussion.  Summary: Patient was engaged in session.  Patient took the final exam for his class and watched the professor grade his test, so there would be no discrepancies.  Patient said he bought a bike for $20 and started riding it.  Patient went to the noon meeting at the [employer].  Patient said he forgot that he had a meeting with his parole officer that day but was able to reschedule it.  Counselor suggested that patient may consider getting an appointment book or calendar, as patient  tries to keep track of his schedule by memory.    UDS collected: NO  AA/NA attended?: YesTuesday  Sponsor?: No   Charmian Muff, LCAS 11/20/2017 9:44 AM

## 2017-11-20 NOTE — Progress Notes (Signed)
    Daily Group Progress Note  Program: CD-IOP   11/20/2017 Ermelinda Das [id number]  Diagnosis: F10.20 Alcohol Use Disorder, Severe  Sobriety Date: 3/31  Group Time: 1-2:30pm  Participation Level: Active  Behavioral Response: Sharing  Type of Therapy: Process Group  Interventions: Supportive  Topic: Patients were active and engaged in process session.  Patients discussed events in their lives, highlighting relational struggles and successes, triggers, and recovery-based activities.  Patients asked questions about sponsorship and shared insecurities and fears that have held them back from attempting to gain a sponsor yet.  Patients engaged in brief role-plays wherein one was a therapist trying to help the other work through barriers to acquiring a sponsor.  Group Time: 2:30-4pm  Participation Level: Active  Behavioral Response: Sharing  Type of Therapy: Psycho-education Group  Interventions: Psychosocial Skills: Vulnerability, Part 2  Topic: Patients were active and engaged in psychoeducation session.  Counselors followed up with the previous day's topic of vulnerability and facilitated education and discussion around what vulnerability looks like.  Patients provided examples of relationships and instances in their lives that are difficult to be vulnerable.  Counselors engaged patients in conversation about how sponsorship can be a key relationship in beginning to practice vulnerability.  Patients responded well to psychoeducation portion of group.  Summary: Patient was engaged in session.  Patient grilled the night before and said he is loving his new place.  Patient accidentally locked his key in his car trunk and is trying to figure out how to break into the trunk to get it out.  Patient went to a meeting where the topic was identification as an alcoholic, which patient said he does not have difficulty doing for himself.  Patient reflected that "addiction doesn't  discriminate," as he has seen men and women of different race, status, background, SES, etc. at meetings.  Patient provided helpful feedback to other group members.  Patient inquired about the role of a sponsor and said that his lack of understanding has led to some hesitation about approaching someone to be his sponsor. He made some good comments and responded well to this intervention.  UDS collected: No Results:   AA/NA attended?: YesThursday  Sponsor?: No   Charmian Muff, LCAS 11/20/2017 9:18 AM

## 2017-11-22 ENCOUNTER — Encounter (HOSPITAL_COMMUNITY): Payer: Self-pay | Admitting: Medical

## 2017-11-22 ENCOUNTER — Other Ambulatory Visit (INDEPENDENT_AMBULATORY_CARE_PROVIDER_SITE_OTHER): Payer: 59 | Admitting: Psychology

## 2017-11-22 DIAGNOSIS — F1211 Cannabis abuse, in remission: Secondary | ICD-10-CM

## 2017-11-22 DIAGNOSIS — Z62812 Personal history of neglect in childhood: Secondary | ICD-10-CM

## 2017-11-22 DIAGNOSIS — Z811 Family history of alcohol abuse and dependence: Secondary | ICD-10-CM

## 2017-11-22 DIAGNOSIS — F1020 Alcohol dependence, uncomplicated: Secondary | ICD-10-CM | POA: Diagnosis present

## 2017-11-22 DIAGNOSIS — F4312 Post-traumatic stress disorder, chronic: Secondary | ICD-10-CM | POA: Diagnosis present

## 2017-11-22 DIAGNOSIS — Z6372 Alcoholism and drug addiction in family: Secondary | ICD-10-CM

## 2017-11-22 DIAGNOSIS — Z9189 Other specified personal risk factors, not elsewhere classified: Secondary | ICD-10-CM

## 2017-11-22 MED ORDER — PREGABALIN 150 MG PO CAPS: 150.0000 mg | ORAL_CAPSULE | Freq: Three times a day (TID) | ORAL | 1 refills | Status: DC

## 2017-11-22 NOTE — Progress Notes (Signed)
    Daily Group Progress Note  Program: CD-IOP   11/22/2017 Lolita Lenz [id number]  Diagnosis:  Alcohol use disorder, severe, dependence ([place])  Chronic post-traumatic stress disorder (PTSD)   Sobriety Date: 3/31  Group Time: 1-2:30  Participation Level: Active  Behavioral Response: Appropriate and Sharing  Type of Therapy: Process Group  Interventions: CBT  Topic: Patients were active and engaged in process session. Counselors led pt's in discussion concerning their recovery from mind-altering drugs. Emphasis on goals from tx plan and thoughts and feelings processing. UDS results were collected from some members. Some members met w/ Investment banker, operational.      Group Time: 2:30-4  Participation Level: Active  Behavioral Response: Appropriate and Sharing  Type of Therapy: Psycho-education Group  Interventions: Meditation: Mindful eating, Chair Yoga  Topic: Patients were active and engaged in group psychoeducation session. A guest LPC, Jan Fireman, led group in a 1hr chair yoga routine for relaxation. Counselors processed ways that pts could implement parts of chair yoga in their own daily routine. Counselor led a 15 min mindful eating exercise using chocolate pieces.    Summary: Pt was active and engaged in session. He reported he broke up w/ his girlfriend on Friday after an argument, and states "it was a long time coming". Pt then went on a date the following night w/ an old friend. Pt reported the friend had one drink but did not pressure pt to drink. Pt did not feel triggered by the date drinking. Pt reports he is taking medications as prescribed and feels good. Pt checked in w/ his PO concerning his jail sentence and how he may have to complete weekends in 4 months. Pt participated actively during chair yoga and mindful eating.   UDS collected: Yes Results: negative  AA/NA attended?: YesFriday  Sponsor?: No   Wes Nataliee Shurtz, LPCA LCASA 11/22/2017 2:50 PM
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
## 2017-11-23 ENCOUNTER — Other Ambulatory Visit (HOSPITAL_COMMUNITY): Payer: 59 | Admitting: Psychology

## 2017-11-23 DIAGNOSIS — F1020 Alcohol dependence, uncomplicated: Secondary | ICD-10-CM
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
## 2017-11-27 ENCOUNTER — Other Ambulatory Visit (HOSPITAL_COMMUNITY): Payer: 59

## 2017-11-27 ENCOUNTER — Telehealth (HOSPITAL_COMMUNITY): Payer: Self-pay | Admitting: Psychology

## 2017-11-28 ENCOUNTER — Encounter (HOSPITAL_COMMUNITY): Payer: Self-pay | Admitting: Psychology

## 2017-11-28 NOTE — Progress Notes (Signed)
    Daily Group Progress Note  Program: CD-IOP   11/28/2017 Brian Kane [id number]  Diagnosis: Alcohol Use Disorder, Severe  Sobriety Date: 10/15/17  Group Time: 1-2:30pm  Participation Level: Active  Behavioral Response: Sharing  Type of Therapy: Process Group  Interventions: Supportive  Topic: Process: The first half of group was spent in process. Members shared about any challenges or struggles they had experienced since we last met. A mew group member appeared, and he was asked to introduce himself and share a little about what had brought him here. The group was very validating and welcoming to this newest group member. Two random drug tests were collected.   Group Time: 2:30-4pm  Participation Level: Active  Behavioral Response: Sharing  Type of Therapy: Psycho-education Group  Interventions: Strength-based  Topic: Progress Guided Relaxation/Psycho-Ed; Neurobiology, Part 2. The second half of group began with a 15-minute guided progressive relaxation. Members closed their eyes as the counselor guided them through the exercise. Several members described the exercise as being relaxing and two felt so relaxed they might go to sleep. The psycho-ed began with a brief [employer] clip showing a patient undergoing an MRI and the benefits derived from a medication called 'Baclofen'. It is one that is frequently prescribed by our medical director. A discussion ensued about the emotional memory of the primitive brain. Members described their own experiences in situations where those memories were activated. The discussion was lively with good feedback among group members.  Summary: The patient reported he didn't do much yesterday after group. He went home and fell asleep on his couch. The patient reported he would meet with his probation officer at the end of the month. The word he drew on the popsicle stick was 'excuses'. He admitted, 'this speaks volumes' in trying not to use my  many excuses. The patient reported "I didn't want to come to group today and was trying to think up an excuse, but I came any way. A group member thanked him for coming even if he didn't want to. When asked how he would explain addiction to someone who didn't understand it, he would try to teach them 'it's a disease'. The patient remains sober, but he remains emotionally distant and has yet to secure a sponsor. It is unclear how he thinks he will remain alcohol-free once the accountability of this program is gone.    UDS collected: No Results:   AA/NA attended?: No  Sponsor?: No   [name], LCAS 11/28/2017 9:55 AM

## 2017-11-29 ENCOUNTER — Other Ambulatory Visit (HOSPITAL_COMMUNITY): Payer: 59 | Admitting: Psychology

## 2017-11-29 ENCOUNTER — Encounter (HOSPITAL_COMMUNITY): Payer: Self-pay | Admitting: Medical

## 2017-11-29 ENCOUNTER — Other Ambulatory Visit (HOSPITAL_COMMUNITY): Payer: Self-pay

## 2017-11-29 DIAGNOSIS — F4312 Post-traumatic stress disorder, chronic: Secondary | ICD-10-CM

## 2017-11-29 DIAGNOSIS — Z9189 Other specified personal risk factors, not elsewhere classified: Secondary | ICD-10-CM

## 2017-11-29 DIAGNOSIS — F1020 Alcohol dependence, uncomplicated: Secondary | ICD-10-CM | POA: Diagnosis not present

## 2017-11-29 DIAGNOSIS — Z62812 Personal history of neglect in childhood: Secondary | ICD-10-CM

## 2017-11-29 DIAGNOSIS — F1211 Cannabis abuse, in remission: Secondary | ICD-10-CM

## 2017-11-29 DIAGNOSIS — Z6372 Alcoholism and drug addiction in family: Secondary | ICD-10-CM

## 2017-11-29 DIAGNOSIS — Z811 Family history of alcohol abuse and dependence: Secondary | ICD-10-CM

## 2017-11-29 MED ORDER — PREGABALIN 150 MG PO CAPS: 150.0000 mg | ORAL_CAPSULE | Freq: Three times a day (TID) | ORAL | 1 refills | Status: DC

## 2017-11-29 MED ORDER — TRAZODONE HCL 150 MG PO TABS: 150.0000 mg | ORAL_TABLET | Freq: Every day | ORAL | 1 refills | Status: DC

## 2017-11-29 MED ORDER — SERTRALINE HCL 100 MG PO TABS: ORAL_TABLET | ORAL | 1 refills | Status: DC

## 2017-11-29 MED ORDER — LAMOTRIGINE 150 MG PO TABS: 150.0000 mg | ORAL_TABLET | Freq: Every day | ORAL | 0 refills | Status: DC

## 2017-11-29 MED ORDER — LAMOTRIGINE 25 MG PO TABS: ORAL_TABLET | ORAL | 0 refills | Status: DC
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
## 2017-11-30 ENCOUNTER — Other Ambulatory Visit (HOSPITAL_COMMUNITY): Payer: 59 | Admitting: Psychology

## 2017-11-30 ENCOUNTER — Encounter (HOSPITAL_COMMUNITY): Payer: Self-pay | Admitting: Psychology

## 2017-11-30 DIAGNOSIS — F1020 Alcohol dependence, uncomplicated: Secondary | ICD-10-CM | POA: Diagnosis not present

## 2017-11-30 DIAGNOSIS — F4312 Post-traumatic stress disorder, chronic: Secondary | ICD-10-CM
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
## 2017-12-04 ENCOUNTER — Telehealth (HOSPITAL_COMMUNITY): Payer: Self-pay | Admitting: Psychology

## 2017-12-04 ENCOUNTER — Other Ambulatory Visit (HOSPITAL_COMMUNITY): Payer: 59

## 2017-12-06 ENCOUNTER — Other Ambulatory Visit (HOSPITAL_COMMUNITY): Payer: 59

## 2017-12-07 ENCOUNTER — Encounter (HOSPITAL_COMMUNITY): Payer: Self-pay | Admitting: Psychology

## 2017-12-07 ENCOUNTER — Other Ambulatory Visit (HOSPITAL_COMMUNITY): Payer: 59

## 2017-12-07 NOTE — Progress Notes (Signed)
    Daily Group Progress Note  Program: CD-IOP   12/07/2017 Lolita Lenz [id number]  Diagnosis:  Alcohol use disorder, severe, dependence ([place])  Chronic post-traumatic stress disorder (PTSD)   Sobriety Date: 3/31  Group Time: 1-2:30  Participation Level: Active  Behavioral Response: Appropriate, Agitated and Evasive  Type of Therapy: Process Group  Interventions: CBT, Motivational Interviewing and Supportive  Topic: Patients were active and engaged in process session. Counselors led pt's in discussion concerning their recovery from mind-altering drugs. Counselors emphasized pt's goals from tx plan and thoughts and feelings processing. UDS results were returned to some members.       Group Time: 2:30-4  Participation Level: Active  Behavioral Response: Appropriate and Sharing  Type of Therapy: Psycho-education Group  Interventions: Psychosocial Skills: Teacher, adult education, Control  Topic: Patients were active and engaged in group psychoeducation session. Counselors led lesson on 12 Step Serenity Prayer and "control". Pts were provided a handout and asked to fill in 5 things they can control and 5 things they cannot control and then asked to share.    Summary: Pt was somewhat engaged in session w/ mild activity. He reported he is "in a bad place right now" and indicated he is experiencing sxs of anxiety and depression. Pt struggled to articulate his feelings and withheld any emotion while in session. Pt was invited by counselors to invite his emotions and stay w/ them, though pt continually changed topic and was intentionally vague. Pt reported he attended 1 AA meeting this morning, since last session. He stated he met w/ his pastor to talk about "some difficulty things" though he evaded questions about the content of their meeting. Pt denied strongly any SI or HI. Pt was mostly quiet during 2nd half though he filled out the handout actively.   UDS collected: No  Results: negative  AA/NA attended?: YesThursday  Sponsor?: No   Wes Swan, LPCA LCASA 12/07/2017 8:43 AM

## 2017-12-13 ENCOUNTER — Encounter (HOSPITAL_COMMUNITY): Payer: Self-pay | Admitting: Medical

## 2017-12-13 ENCOUNTER — Other Ambulatory Visit (HOSPITAL_COMMUNITY): Payer: 59 | Admitting: Medical
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
## 2017-12-14 ENCOUNTER — Other Ambulatory Visit (HOSPITAL_COMMUNITY): Payer: 59

## 2017-12-27 ENCOUNTER — Other Ambulatory Visit (HOSPITAL_COMMUNITY): Payer: Self-pay | Admitting: Medical

## 2018-03-11 ENCOUNTER — Other Ambulatory Visit: Payer: Self-pay

## 2018-03-11 ENCOUNTER — Encounter (HOSPITAL_COMMUNITY): Payer: Self-pay

## 2018-03-11 ENCOUNTER — Emergency Department (HOSPITAL_COMMUNITY): Payer: 59

## 2018-03-11 ENCOUNTER — Emergency Department (HOSPITAL_COMMUNITY)
Admission: EM | Admit: 2018-03-11 | Discharge: 2018-03-11 | Disposition: A | Discharge status: Home / Self Care | Payer: 59 | Attending: Emergency Medicine | Admitting: Emergency Medicine

## 2018-03-11 DIAGNOSIS — Y999 Unspecified external cause status: Secondary | ICD-10-CM | POA: Diagnosis not present

## 2018-03-11 DIAGNOSIS — Z79899 Other long term (current) drug therapy: Secondary | ICD-10-CM | POA: Diagnosis not present

## 2018-03-11 DIAGNOSIS — F17200 Nicotine dependence, unspecified, uncomplicated: Secondary | ICD-10-CM | POA: Insufficient documentation

## 2018-03-11 DIAGNOSIS — Y929 Unspecified place or not applicable: Secondary | ICD-10-CM | POA: Insufficient documentation

## 2018-03-11 DIAGNOSIS — M79641 Pain in right hand: Secondary | ICD-10-CM | POA: Diagnosis not present

## 2018-03-11 DIAGNOSIS — S51812A Laceration without foreign body of left forearm, initial encounter: Secondary | ICD-10-CM | POA: Insufficient documentation

## 2018-03-11 DIAGNOSIS — Z23 Encounter for immunization: Secondary | ICD-10-CM | POA: Insufficient documentation

## 2018-03-11 DIAGNOSIS — S81012A Laceration without foreign body, left knee, initial encounter: Secondary | ICD-10-CM | POA: Diagnosis not present

## 2018-03-11 DIAGNOSIS — Y939 Activity, unspecified: Secondary | ICD-10-CM | POA: Insufficient documentation

## 2018-03-11 MED ORDER — TETANUS-DIPHTH-ACELL PERTUSSIS 5-2.5-18.5 LF-MCG/0.5 IM SUSP
0.5000 mL | Freq: Once | INTRAMUSCULAR | Status: AC
  Administered 2018-03-11: 0.5 mL via INTRAMUSCULAR
  Filled 2018-03-11: qty 0.5

## 2018-03-11 MED ORDER — LIDOCAINE-EPINEPHRINE (PF) 2 %-1:200000 IJ SOLN
10.0000 mL | Freq: Once | INTRAMUSCULAR | Status: AC
  Administered 2018-03-11: 10 mL
  Filled 2018-03-11: qty 20

## 2018-03-11 MED ORDER — BACITRACIN ZINC 500 UNIT/GM EX OINT: TOPICAL_OINTMENT | Freq: Once | CUTANEOUS | Status: DC
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
## 2018-03-11 NOTE — ED Notes (Signed)
Patient is A&Ox4.  No signs of distress noted.  Please see providers complete history and physical exam.  
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
## 2019-05-20 ENCOUNTER — Other Ambulatory Visit: Payer: Self-pay

## 2019-05-20 DIAGNOSIS — Z20822 Contact with and (suspected) exposure to covid-19: Secondary | ICD-10-CM

## 2019-05-21 LAB — NOVEL CORONAVIRUS, NAA: SARS-CoV-2, NAA: NOT DETECTED

## 2019-08-03 ENCOUNTER — Encounter (HOSPITAL_COMMUNITY): Payer: Self-pay | Admitting: Emergency Medicine

## 2019-08-03 ENCOUNTER — Other Ambulatory Visit: Payer: Self-pay

## 2019-08-03 ENCOUNTER — Emergency Department (HOSPITAL_COMMUNITY)
Admission: EM | Admit: 2019-08-03 | Discharge: 2019-08-04 | Disposition: A | Discharge status: Home / Self Care | Payer: No Typology Code available for payment source | Attending: Emergency Medicine | Admitting: Emergency Medicine

## 2019-08-03 DIAGNOSIS — F17200 Nicotine dependence, unspecified, uncomplicated: Secondary | ICD-10-CM | POA: Insufficient documentation

## 2019-08-03 DIAGNOSIS — Y9389 Activity, other specified: Secondary | ICD-10-CM | POA: Diagnosis not present

## 2019-08-03 DIAGNOSIS — F10920 Alcohol use, unspecified with intoxication, uncomplicated: Secondary | ICD-10-CM

## 2019-08-03 DIAGNOSIS — M542 Cervicalgia: Secondary | ICD-10-CM | POA: Diagnosis not present

## 2019-08-03 DIAGNOSIS — F10929 Alcohol use, unspecified with intoxication, unspecified: Secondary | ICD-10-CM | POA: Diagnosis not present

## 2019-08-03 DIAGNOSIS — Y999 Unspecified external cause status: Secondary | ICD-10-CM | POA: Insufficient documentation

## 2019-08-03 DIAGNOSIS — Y9289 Other specified places as the place of occurrence of the external cause: Secondary | ICD-10-CM | POA: Diagnosis not present

## 2019-08-03 DIAGNOSIS — R4182 Altered mental status, unspecified: Secondary | ICD-10-CM | POA: Diagnosis present
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
## 2019-08-04 ENCOUNTER — Emergency Department (HOSPITAL_COMMUNITY): Payer: No Typology Code available for payment source

## 2019-08-04 MED ORDER — HALOPERIDOL LACTATE 5 MG/ML IJ SOLN
5.0000 mg | INTRAMUSCULAR | Status: DC | PRN
  Filled 2019-08-04: qty 1

## 2019-08-04 NOTE — ED Notes (Signed)
Pt found walking in another unit trying to leave. Placed back in room. Pt becoming increasingly augmentative with staff. Pt called sister to come get him.

## 2019-08-04 NOTE — ED Notes (Signed)
Contacted fiancee who will come to pick pt up

## 2019-08-04 NOTE — ED Notes (Signed)
Pt verbalized understanding of d/c instruction, follow up care. Pt released to fiansee. Pt did not have any further questions.

## 2019-08-04 NOTE — ED Notes (Signed)
pT pulled off monitoring leads and b/p. Refuses to have vitals monitored

## 2019-08-04 NOTE — Discharge Instructions (Addendum)
During the workup we noted incidental findings on your imaging that would require you to follow-up with your regular doctor for further evaluation/management: Asymmetric diffuse enlargement of the left thyroid gland with a 2  cm nodule in the superior left thyroid gland.Further evaluation with  an outpatient thyroid ultrasound is recommended.(Ref: J Am Coll  Radiol. 2015 Feb;12(2): 143-50).

## 2020-04-20 ENCOUNTER — Other Ambulatory Visit: Payer: Self-pay | Admitting: Otolaryngology

## 2020-04-20 DIAGNOSIS — J329 Chronic sinusitis, unspecified: Secondary | ICD-10-CM

## 2020-05-01 ENCOUNTER — Ambulatory Visit
Admission: RE | Admit: 2020-05-01 | Discharge: 2020-05-01 | Disposition: A | Discharge status: Home / Self Care | Payer: BC Managed Care – PPO | Source: Ambulatory Visit | Attending: Otolaryngology | Admitting: Otolaryngology

## 2020-05-01 DIAGNOSIS — J329 Chronic sinusitis, unspecified: Secondary | ICD-10-CM

## 2020-09-10 ENCOUNTER — Emergency Department (HOSPITAL_COMMUNITY): Payer: BC Managed Care – PPO

## 2020-09-10 ENCOUNTER — Emergency Department (HOSPITAL_COMMUNITY)
Admission: EM | Admit: 2020-09-10 | Discharge: 2020-09-10 | Disposition: A | Discharge status: Home / Self Care | Payer: BC Managed Care – PPO | Attending: Emergency Medicine | Admitting: Emergency Medicine

## 2020-09-10 ENCOUNTER — Encounter (HOSPITAL_COMMUNITY): Payer: Self-pay | Admitting: Emergency Medicine

## 2020-09-10 ENCOUNTER — Other Ambulatory Visit: Payer: Self-pay

## 2020-09-10 DIAGNOSIS — F17200 Nicotine dependence, unspecified, uncomplicated: Secondary | ICD-10-CM | POA: Insufficient documentation

## 2020-09-10 DIAGNOSIS — M25561 Pain in right knee: Secondary | ICD-10-CM
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
## 2020-09-10 NOTE — ED Notes (Signed)
Pt's right knee is swollen , no specific injury.

## 2020-09-10 NOTE — ED Notes (Signed)
Ortho tech en route 

## 2020-09-10 NOTE — ED Triage Notes (Signed)
Pt here with c/o right knee pain , no trauma noted

## 2020-09-10 NOTE — Progress Notes (Signed)
Orthopedic Tech Progress Note Patient Details:  Brian Kane Dec 24, 1980 [id number]  Ortho Devices Type of Ortho Device: Crutches,Knee Immobilizer Ortho Device/Splint Location: RLE Ortho Device/Splint Interventions: Application,Ordered   Post Interventions Patient Tolerated: Well Instructions Provided: Adjustment of device   [name] [name] 09/10/2020, 12:42 PM

## 2020-09-10 NOTE — Discharge Instructions (Addendum)
At this time there does not appear to be the presence of an emergent medical condition, however there is always the potential for conditions to change. Please read and follow the below instructions.  Please return to the Emergency Department immediately for any new or worsening symptoms. Please be sure to follow up with your Primary Care Provider within one week regarding your visit today; please call their office to schedule an appointment even if you are feeling better for a follow-up visit. Call the orthopedic specialist Dr. Carola Frost on your discharge paperwork to schedule follow-up appointment for further assessment of your knee pain and fluid buildup.  Please use the knee immobilizer and crutches when moving around to prevent your knee from further injury.  Please use rest ice and elevation along with over-the-counter anti-inflammatory such as Tylenol or ibuprofen to help with your symptoms.  You may remove the knee immobilizer when you are resting.  Go to the nearest Emergency Department immediately if: You have fever or chills Your knee swells, and the swelling gets worse. You cannot move your knee. You have very bad knee pain that does not get better with pain medicine. You have any new/concerning or worsening of symptoms   Please read the additional information packets attached to your discharge summary.  Do not take your medicine if  develop an itchy rash, swelling in your mouth or lips, or difficulty breathing; call 911 and seek immediate emergency medical attention if this occurs.  You may review your lab tests and imaging results in their entirety on your MyChart account.  Please discuss all results of fully with your primary care provider and other specialist at your follow-up visit.  Note: Portions of this text may have been transcribed using voice recognition software. Every effort was made to ensure accuracy; however, inadvertent computerized transcription errors may still be  present.

## 2020-09-10 NOTE — ED Notes (Signed)
Patient discharge instructions reviewed with the patient. The patient verbalized understanding. Patient discharged. 

## 2020-10-19 ENCOUNTER — Emergency Department (HOSPITAL_COMMUNITY)
Admission: EM | Admit: 2020-10-19 | Discharge: 2020-10-19 | Disposition: A | Discharge status: Home / Self Care | Payer: Self-pay | Attending: Emergency Medicine | Admitting: Emergency Medicine

## 2020-10-19 ENCOUNTER — Other Ambulatory Visit: Payer: Self-pay

## 2020-10-19 DIAGNOSIS — Z5321 Procedure and treatment not carried out due to patient leaving prior to being seen by health care provider: Secondary | ICD-10-CM | POA: Insufficient documentation

## 2020-10-19 DIAGNOSIS — M79604 Pain in right leg: Secondary | ICD-10-CM | POA: Insufficient documentation

## 2020-10-19 NOTE — ED Notes (Signed)
LWBS 

## 2020-10-19 NOTE — ED Triage Notes (Signed)
Emergency Medicine Provider Triage Evaluation Note  Brian Kane , a 40 y.o. male  was evaluated in triage.  Pt complains of  Swelling in the right leg.  He has reported that he injured it a few weeks ago at work and is still in pain.    Physical Exam  BP (!) 147/96 (BP Location: Left Arm)   Pulse 82   Temp 98.2 F (36.8 C) (Oral)   Resp 16   SpO2 98%  Patient is awake and alert, he is in no obvious distress.  Respirations are reviewed and unlabored.  Right knee with mild edema.  He is able to bend the knee and not holding it in full extension.  Medical Decision Making  Medically screening exam initiated at 6:42 PM.  Appropriate orders placed.  Brian Kane was informed that the remainder of the evaluation will be completed by another provider, this initial triage assessment does not replace that evaluation, and the importance of remaining in the ED until their evaluation is complete.  Clinical Impression  Right knee pain   Cristina Gong, [place] 10/19/20 1846

## 2020-10-19 NOTE — ED Triage Notes (Signed)
Pt reports being seen for injury to R knee at work a few weeks ago. Was given brace, etc and reports leg is still swollen and pain is migrating up into thigh instead of remaining localized at knee like it was.

## 2021-04-06 ENCOUNTER — Other Ambulatory Visit: Payer: Self-pay

## 2021-04-06 ENCOUNTER — Ambulatory Visit
Admission: EM | Admit: 2021-04-06 | Discharge: 2021-04-06 | Disposition: A | Discharge status: Home / Self Care | Payer: Self-pay | Attending: Internal Medicine | Admitting: Internal Medicine

## 2021-04-06 ENCOUNTER — Encounter: Payer: Self-pay | Admitting: Emergency Medicine

## 2021-04-06 DIAGNOSIS — J0110 Acute frontal sinusitis, unspecified: Secondary | ICD-10-CM

## 2021-04-06 DIAGNOSIS — R519 Headache, unspecified: Secondary | ICD-10-CM

## 2021-04-06 MED ORDER — AMOXICILLIN-POT CLAVULANATE 875-125 MG PO TABS: 1.0000 | ORAL_TABLET | Freq: Two times a day (BID) | ORAL | 0 refills | Status: DC

## 2021-04-06 MED ORDER — PREDNISONE 20 MG PO TABS: 40.0000 mg | ORAL_TABLET | Freq: Every day | ORAL | 0 refills | Status: AC
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
## 2021-04-06 NOTE — Discharge Instructions (Signed)
You are being treated with Augmentin antibiotic and prednisone steroid to help treat sinus infection.  Please continue to follow-up with ENT specialist in October for further evaluation and management.  You have been provided contact information for headache clinic and neurology if headache persists.  Go the hospital if symptoms worsen.

## 2021-04-06 NOTE — ED Triage Notes (Addendum)
Headaches x 3-4 weeks, up to 3 per day. Right sided headache. States when the headaches occur it causes his right eye to swell shut with the surrounding skin feeling hot. Reports occasional dizziness and being unable to see through right eye due to swelling. Denies fever, syncope, nausea. History of cluster headaches, states he doesn't think that's what it is. Has had head imaging in the past

## 2021-04-12 ENCOUNTER — Ambulatory Visit (INDEPENDENT_AMBULATORY_CARE_PROVIDER_SITE_OTHER): Payer: BC Managed Care – PPO | Admitting: Family Medicine

## 2021-04-12 ENCOUNTER — Other Ambulatory Visit: Payer: Self-pay

## 2021-04-12 ENCOUNTER — Encounter: Payer: Self-pay | Admitting: Family Medicine

## 2021-04-12 VITALS — BP 148/92 | HR 78 | Temp 97.0°F | Resp 18 | Wt 284.6 lb

## 2021-04-12 DIAGNOSIS — R0982 Postnasal drip: Secondary | ICD-10-CM

## 2021-04-12 DIAGNOSIS — G44059 Short lasting unilateral neuralgiform headache with conjunctival injection and tearing (SUNCT), not intractable: Secondary | ICD-10-CM

## 2021-04-12 DIAGNOSIS — E041 Nontoxic single thyroid nodule: Secondary | ICD-10-CM | POA: Diagnosis not present

## 2021-04-12 DIAGNOSIS — F1020 Alcohol dependence, uncomplicated: Secondary | ICD-10-CM

## 2021-04-12 DIAGNOSIS — R030 Elevated blood-pressure reading, without diagnosis of hypertension: Secondary | ICD-10-CM

## 2021-04-12 LAB — COMPREHENSIVE METABOLIC PANEL
ALT: 18 U/L (ref 0–53)
AST: 15 U/L (ref 0–37)
Albumin: 4.2 g/dL (ref 3.5–5.2)
Alkaline Phosphatase: 69 U/L (ref 39–117)
BUN: 24 mg/dL — ABNORMAL HIGH (ref 6–23)
CO2: 25 mEq/L (ref 19–32)
Calcium: 9.3 mg/dL (ref 8.4–10.5)
Chloride: 99 mEq/L (ref 96–112)
Creatinine, Ser: 1.2 mg/dL (ref 0.40–1.50)
GFR: 75.39 mL/min (ref >60.00)
Glucose, Bld: 78 mg/dL (ref 70–99)
Potassium: 4.2 mEq/L (ref 3.5–5.1)
Sodium: 136 mEq/L (ref 135–145)
Total Bilirubin: 0.3 mg/dL (ref 0.2–1.2)
Total Protein: 7.2 g/dL (ref 6.0–8.3)

## 2021-04-12 LAB — CBC
HCT: 49.3 % (ref 39.0–52.0)
Hemoglobin: 16.4 g/dL (ref 13.0–17.0)
MCHC: 33.2 g/dL (ref 30.0–36.0)
MCV: 93 fl (ref 78.0–100.0)
Platelets: 301 10*3/uL (ref 150.0–400.0)
RBC: 5.3 Mil/uL (ref 4.22–5.81)
RDW: 13.9 % (ref 11.5–15.5)
WBC: 15.7 10*3/uL — ABNORMAL HIGH (ref 4.0–10.5)

## 2021-04-12 LAB — TSH: TSH: 1.05 u[IU]/mL (ref 0.35–5.50)

## 2021-04-12 LAB — T4, FREE: Free T4: 0.74 ng/dL (ref 0.60–1.60)

## 2021-04-12 LAB — SEDIMENTATION RATE: Sed Rate: 26 mm/hr — ABNORMAL HIGH (ref 0–15)

## 2021-04-12 MED ORDER — ELETRIPTAN HYDROBROMIDE 20 MG PO TABS: 20.0000 mg | ORAL_TABLET | ORAL | 0 refills | Status: DC | PRN

## 2021-04-12 MED ORDER — CETIRIZINE HCL 10 MG PO TABS: 10.0000 mg | ORAL_TABLET | Freq: Every day | ORAL | 11 refills | Status: DC

## 2021-04-12 NOTE — Progress Notes (Signed)
New Patient Office Visit  Subjective:  Patient ID: Brian Kane, male    DOB: 1981/06/30  Age: 40 y.o. MRN: [id number]  CC:  Chief Complaint  Patient presents with   Establish Care    New pt. Pt would like to discuss headache located on right side of his head from front to back x3 week, pt denies injury. Thyroid.    HPI Brian Kane presents for establishment of care and evaluation of a 3-week history of headaches.  The headaches occur on the right side of his head.  They pound.  They were preceded by mild throbbing and tearing in his right eye.  Headaches occur up to 5 times daily.  They can last up to 4 hours.  He has had a runny nose with them.  There is been some sinus pressure.  Patient denies headache history.  Denies prodromal aura or scotomata.  Denies fevers chills, rash, light sensitivity.  Denies sneezing.  He has tried sinus Excedrin with some relief.  He was seen at urgent care on Monday a week ago.  He was given antibiotics and prednisone.  These have not helped..  Headaches seem to be worsening.  Nasal steroids make his nose burn burn.  These have not helped.  History of alcohol abuse disorder.  Tells me that he has not been drinking lately.  He is accompanied by his wife.  CT of head in 2021 was normal.  CT of neck showed diffuse enlargement of the left thyroid gland with a 2 cm nodule.  History reviewed. No pertinent past medical history.  History reviewed. No pertinent surgical history.  History reviewed. No pertinent family history.  Social History   Socioeconomic History   Marital status: Married    Spouse name: Not on file   Number of children: Not on file   Years of education: Not on file   Highest education level: Not on file  Occupational History   Not on file  Tobacco Use   Smoking status: Some Days   Smokeless tobacco: Never  Substance and Sexual Activity   Alcohol use: Yes    Comment: 5-6 drinks 2 days/week   Drug use: Not Currently    Sexual activity: Yes  Other Topics Concern   Not on file  Social History Narrative   Not on file   Social Determinants of Health   Financial Resource Strain: Not on file  Food Insecurity: Not on file  Transportation Needs: Not on file  Physical Activity: Not on file  Stress: Not on file  Social Connections: Not on file  Intimate Partner Violence: Not on file    ROS Review of Systems  Constitutional:  Negative for chills, diaphoresis, fatigue, fever and unexpected weight change.  HENT:  Positive for congestion, postnasal drip and sinus pressure. Negative for sneezing.   Eyes:  Negative for photophobia and visual disturbance.  Respiratory:  Negative for cough.   Cardiovascular: Negative.   Gastrointestinal: Negative.  Negative for nausea and vomiting.  Musculoskeletal:  Negative for gait problem and joint swelling.  Neurological:  Positive for headaches. Negative for speech difficulty, weakness and light-headedness.   Objective:   Today's Vitals: BP (!) 148/92 (BP Location: Right Arm, Patient Position: Sitting, Cuff Size: Large)   Pulse 78   Temp (!) 97 F (36.1 C) (Temporal)   Resp 18   Wt 284 lb 9.6 oz (129.1 kg)   SpO2 99%   BMI 38.60 kg/m   Physical Exam Vitals and  nursing note reviewed.  Constitutional:      General: He is not in acute distress.    Appearance: Normal appearance. He is not ill-appearing, toxic-appearing or diaphoretic.  HENT:     Head: Normocephalic and atraumatic.      Right Ear: Tympanic membrane, ear canal and external ear normal.     Left Ear: Tympanic membrane, ear canal and external ear normal.     Mouth/Throat:     Mouth: Mucous membranes are moist.     Pharynx: Oropharynx is clear. No oropharyngeal exudate or posterior oropharyngeal erythema.  Eyes:     General: No scleral icterus.       Right eye: No discharge.        Left eye: No discharge.     Extraocular Movements: Extraocular movements intact.     Conjunctiva/sclera:  Conjunctivae normal.     Pupils: Pupils are equal, round, and reactive to light.  Cardiovascular:     Rate and Rhythm: Normal rate and regular rhythm.  Pulmonary:     Effort: Pulmonary effort is normal.     Breath sounds: Normal breath sounds.  Abdominal:     General: Bowel sounds are normal.  Musculoskeletal:     Cervical back: No rigidity or tenderness.  Lymphadenopathy:     Cervical: No cervical adenopathy.  Skin:    General: Skin is warm and dry.  Neurological:     Mental Status: He is alert and oriented to person, place, and time.     Cranial Nerves: No cranial nerve deficit, dysarthria or facial asymmetry.     Motor: No weakness.  Psychiatric:        Mood and Affect: Mood normal.        Behavior: Behavior normal.    Assessment & Plan:   Problem List Items Addressed This Visit       Endocrine   Thyroid nodule   Relevant Orders   US THYROID   TSH   T4, free     Other   Alcohol use disorder, severe, dependence (HCC)   Post-nasal drip   Relevant Medications   cetirizine (ZYRTEC) 10 MG tablet   Elevated BP without diagnosis of hypertension   Relevant Orders   Comprehensive metabolic panel   CBC   Short lasting unilateral neuralgiform headache with conjunctival injection and tearing (SUNCT), not intractable - Primary   Relevant Medications   eletriptan (RELPAX) 20 MG tablet   Other Relevant Orders   MR Brain W Wo Contrast   Comprehensive metabolic panel   CBC   Sedimentation rate    Outpatient Encounter Medications as of 04/12/2021  Medication Sig   cetirizine (ZYRTEC) 10 MG tablet Take 1 tablet (10 mg total) by mouth daily.   eletriptan (RELPAX) 20 MG tablet Take 1 tablet (20 mg total) by mouth as needed for migraine or headache. May repeat in 2 hours if headache persists or recurs.   amoxicillin-clavulanate (AUGMENTIN) 875-125 MG tablet Take 1 tablet by mouth every 12 (twelve) hours. (Patient not taking: Reported on 04/12/2021)   CHANTIX STARTING MONTH PAK  0.5 MG X 11 & 1 MG X 42 tablet  (Patient not taking: Reported on 04/12/2021)   cyclobenzaprine (FLEXERIL) 10 MG tablet Take 1 tablet (10 mg total) by mouth 2 (two) times daily as needed for muscle spasms. (Patient not taking: Reported on 04/12/2021)   lamoTRIgine (LAMICTAL) 150 MG tablet Take 1 tablet (150 mg total) by mouth daily. Start after 25 mg tabs completed in 20 days  lamoTRIgine (LAMICTAL) 25 MG tablet Take 1 tab x 5 days then 2 tabs x5days then 3 tabs x 5days then 4 tabs daily (Patient not taking: Reported on 04/12/2021)   pregabalin (LYRICA) 150 MG capsule Take 1 capsule (150 mg total) by mouth 3 (three) times daily.   sertraline (ZOLOFT) 100 MG tablet Take 2 tablets daily (Patient not taking: Reported on 04/12/2021)   sertraline (ZOLOFT) 25 MG tablet TAKE 1 TABLET BY MOUTH FOR 5 DAYS THEN INCREASE TO 2 TABLETS (Patient not taking: Reported on 04/12/2021)   traZODone (DESYREL) 150 MG tablet Take 1 tablet (150 mg total) by mouth at bedtime. (Patient not taking: Reported on 04/12/2021)   [DISCONTINUED] SUMAtriptan 6 MG/0.5ML SOAJ Inject into the skin. (Patient not taking: Reported on 04/12/2021)   No facility-administered encounter medications on file as of 04/12/2021.    Follow-up: Return in about 2 weeks (around 04/26/2021).  Headache of uncertain etiology.  Cluster headache would be a concern except that they have lasted up to 2 hours.  Blood pressure could be elevated from recent steroids.  We will try Relpax.  We will consider headache preventative such as Topamax.  Allergy rhinitis could be involved.  Cannot tolerate nasal steroids.  We will try p.m. Zyrtec. Information was given on preventing hypertension as well as general headaches and cluster headaches information was given on thyroid nodules.  Rechecking thyroid function testing is well is sending for thyroid ultrasound. Mliss Sax, MD

## 2021-04-13 ENCOUNTER — Telehealth: Payer: Self-pay | Admitting: Family Medicine

## 2021-04-13 DIAGNOSIS — G44011 Episodic cluster headache, intractable: Secondary | ICD-10-CM

## 2021-04-13 NOTE — Progress Notes (Signed)
Labs do show evidence for inflammation. The MRI of your brain and thyroid US are important studies for you.

## 2021-04-13 NOTE — Telephone Encounter (Signed)
Pt is wanting an extension on his work note, he can not work without his migraine medication. Please advise pt at  [phone]

## 2021-04-14 ENCOUNTER — Encounter: Payer: Self-pay | Admitting: Family Medicine

## 2021-04-14 MED ORDER — ELETRIPTAN HYDROBROMIDE 40 MG PO TABS: ORAL_TABLET | ORAL | 2 refills | Status: DC

## 2021-04-14 NOTE — Telephone Encounter (Signed)
Patient calling states that insurance will only cover Eletriptan 40mg  instead of 20mg . Patient would like increase on dosage. Please advise.

## 2021-04-14 NOTE — Telephone Encounter (Signed)
Increased Rx and work note sent in patient aware.

## 2021-04-14 NOTE — Telephone Encounter (Signed)
Please advise message below. Pateint seen in office on 04/12/21 received work note to return on 04/13/21 patient calling states that he can not return to work with a migraine would like note to be extended. Please advise.

## 2021-04-14 NOTE — Addendum Note (Signed)
Addended by: Andrez Grime on: 04/14/2021 12:24 PM   Modules accepted: Orders

## 2021-04-15 NOTE — Telephone Encounter (Signed)
Spoke with patient who states that he will be going back to work on 04/15/21 he works night shift. Note written patient aware available via Mychart.

## 2021-04-16 ENCOUNTER — Other Ambulatory Visit: Payer: BC Managed Care – PPO

## 2021-04-20 ENCOUNTER — Emergency Department (HOSPITAL_COMMUNITY)
Admission: EM | Admit: 2021-04-20 | Discharge: 2021-04-20 | Discharge status: Against Medical Advice | Payer: BC Managed Care – PPO

## 2021-04-20 ENCOUNTER — Other Ambulatory Visit: Payer: Self-pay

## 2021-04-20 NOTE — ED Notes (Signed)
Patient stated he heard the longest wait was 22 hours and said he was not going to wait any longer. Patient left the emergency room.

## 2021-04-23 ENCOUNTER — Telehealth: Payer: Self-pay

## 2021-04-23 ENCOUNTER — Telehealth: Payer: Self-pay | Admitting: Family Medicine

## 2021-04-23 NOTE — Telephone Encounter (Signed)
Pt said he was returning call but you Brendia Sacks was at lunch, He just asked for a call back

## 2021-04-23 NOTE — Telephone Encounter (Signed)
Spoke with patient went over lab results patient agrees to keep upcoming MRI and [place] per patient he still seems to be having bad headaches he was not able to make it to work last night and will not be there tonight patient states that he have had to take a whole tablet of Eletiptan instead of half and he only has 1 refill left. Patient not sure what else to do about the headaches would like a note for missing work last night and tonight. Please advise. Okay for note?

## 2021-04-23 NOTE — Telephone Encounter (Signed)
Returned patients call message in labs.

## 2021-04-26 ENCOUNTER — Ambulatory Visit (INDEPENDENT_AMBULATORY_CARE_PROVIDER_SITE_OTHER): Payer: BC Managed Care – PPO | Admitting: Family Medicine

## 2021-04-26 ENCOUNTER — Telehealth: Payer: Self-pay | Admitting: Family Medicine

## 2021-04-26 ENCOUNTER — Encounter: Payer: Self-pay | Admitting: Family Medicine

## 2021-04-26 ENCOUNTER — Other Ambulatory Visit: Payer: Self-pay

## 2021-04-26 ENCOUNTER — Ambulatory Visit: Payer: BC Managed Care – PPO

## 2021-04-26 VITALS — BP 156/88 | HR 105 | Temp 97.7°F | Ht 73.0 in | Wt 282.2 lb

## 2021-04-26 DIAGNOSIS — I10 Essential (primary) hypertension: Secondary | ICD-10-CM

## 2021-04-26 DIAGNOSIS — G44059 Short lasting unilateral neuralgiform headache with conjunctival injection and tearing (SUNCT), not intractable: Secondary | ICD-10-CM | POA: Diagnosis not present

## 2021-04-26 DIAGNOSIS — G44011 Episodic cluster headache, intractable: Secondary | ICD-10-CM | POA: Diagnosis not present

## 2021-04-26 MED ORDER — METOPROLOL SUCCINATE ER 50 MG PO TB24: 50.0000 mg | ORAL_TABLET | Freq: Every day | ORAL | 3 refills | Status: DC

## 2021-04-26 MED ORDER — ELETRIPTAN HYDROBROMIDE 40 MG PO TABS: ORAL_TABLET | ORAL | 2 refills | Status: DC

## 2021-04-26 MED ORDER — TOPIRAMATE 25 MG PO TABS: 25.0000 mg | ORAL_TABLET | Freq: Two times a day (BID) | ORAL | 0 refills | Status: DC

## 2021-04-26 NOTE — Progress Notes (Signed)
Established Patient Office Visit  Subjective:  Patient ID: Brian Kane, male    DOB: 10-30-1980  Age: 40 y.o. MRN: [id number]  CC:  Chief Complaint  Patient presents with   Headache    C/O continued headaches, patient would like medication increased do to current dose not helping. Would also like muscle relaxer to help per patient it seem like that helps. Referral to neurologist     HPI Brian Kane presents for follow-up of headaches and blood pressure.  Relpax is helping but he is having to take 80 mg.  Headaches are somewhat predictable.  There is conjunctival inflammation and tearing followed by headache on the right side of his head.  There is some discomfort in the eye.  Vision is not affected.  No past medical history on file.  No past surgical history on file.  No family history on file.  Social History   Socioeconomic History   Marital status: Married    Spouse name: Not on file   Number of children: Not on file   Years of education: Not on file   Highest education level: Not on file  Occupational History   Not on file  Tobacco Use   Smoking status: Some Days   Smokeless tobacco: Never  Substance and Sexual Activity   Alcohol use: Yes    Comment: 5-6 drinks 2 days/week   Drug use: Not Currently   Sexual activity: Yes  Other Topics Concern   Not on file  Social History Narrative   Not on file   Social Determinants of Health   Financial Resource Strain: Not on file  Food Insecurity: Not on file  Transportation Needs: Not on file  Physical Activity: Not on file  Stress: Not on file  Social Connections: Not on file  Intimate Partner Violence: Not on file    Outpatient Medications Prior to Visit  Medication Sig Dispense Refill   cetirizine (ZYRTEC) 10 MG tablet Take 1 tablet (10 mg total) by mouth daily. 30 tablet 11   eletriptan (RELPAX) 40 MG tablet May take 1/2 tablet for migraine. May repeat once in 2 hours if not relieved. 10  tablet 2   lamoTRIgine (LAMICTAL) 150 MG tablet Take 1 tablet (150 mg total) by mouth daily. Start after 25 mg tabs completed in 20 days 90 tablet 0   pregabalin (LYRICA) 150 MG capsule Take 1 capsule (150 mg total) by mouth 3 (three) times daily. 90 capsule 1   No facility-administered medications prior to visit.    No Known Allergies  ROS Review of Systems  Constitutional: Negative.   HENT: Negative.    Eyes:  Positive for pain (mild), discharge and redness. Negative for photophobia and visual disturbance.  Respiratory: Negative.    Cardiovascular: Negative.   Gastrointestinal: Negative.   Neurological:  Positive for headaches.  Psychiatric/Behavioral: Negative.       Objective:    Physical Exam Vitals and nursing note reviewed.  Constitutional:      General: He is not in acute distress.    Appearance: He is well-developed. He is not ill-appearing, toxic-appearing or diaphoretic.  HENT:     Head: Normocephalic and atraumatic.   Eyes:     General: No scleral icterus.    Extraocular Movements: Extraocular movements intact.     Right eye: Normal extraocular motion.     Left eye: Normal extraocular motion.     Pupils: Pupils are equal, round, and reactive to light. Pupils are equal.  Neck:     Meningeal: Brudzinski's sign absent.  Cardiovascular:     Rate and Rhythm: Normal rate and regular rhythm.  Pulmonary:     Effort: Pulmonary effort is normal.     Breath sounds: Normal breath sounds.  Musculoskeletal:     Cervical back: No rigidity.  Lymphadenopathy:     Cervical: No cervical adenopathy.  Neurological:     Mental Status: He is alert.     Cranial Nerves: No dysarthria or facial asymmetry.  Psychiatric:        Mood and Affect: Mood normal.        Speech: Speech normal.        Behavior: Behavior normal.    BP (!) 156/88 (BP Location: Left Arm, Patient Position: Sitting, Cuff Size: Large)   Pulse (!) 105   Temp 97.7 F (36.5 C) (Temporal)   Ht 6\' 1"  (1.854  m)   Wt 282 lb 3.2 oz (128 kg)   SpO2 97%   BMI 37.23 kg/m  Wt Readings from Last 3 Encounters:  04/26/21 282 lb 3.2 oz (128 kg)  04/12/21 284 lb 9.6 oz (129.1 kg)  03/11/18 260 lb (117.9 kg)     Health Maintenance Due  Topic Date Due   HIV Screening  Never done   Hepatitis C Screening  Never done   COVID-19 Vaccine (2 - Booster for Janssen series) 01/20/2020   INFLUENZA VACCINE  Never done    There are no preventive care reminders to display for this patient.  Lab Results  Component Value Date   TSH 1.05 04/12/2021   Lab Results  Component Value Date   WBC 15.7 (H) 04/12/2021   HGB 16.4 04/12/2021   HCT 49.3 04/12/2021   MCV 93.0 04/12/2021   PLT 301.0 04/12/2021   Lab Results  Component Value Date   NA 136 04/12/2021   K 4.2 04/12/2021   CO2 25 04/12/2021   GLUCOSE 78 04/12/2021   BUN 24 (H) 04/12/2021   CREATININE 1.20 04/12/2021   BILITOT 0.3 04/12/2021   ALKPHOS 69 04/12/2021   AST 15 04/12/2021   ALT 18 04/12/2021   PROT 7.2 04/12/2021   ALBUMIN 4.2 04/12/2021   CALCIUM 9.3 04/12/2021   GFR 75.39 04/12/2021   No results found for: CHOL No results found for: HDL No results found for: LDLCALC No results found for: TRIG No results found for: CHOLHDL No results found for: 04/14/2021    Assessment & Plan:   Problem List Items Addressed This Visit       Nervous and Auditory   Intractable episodic cluster headache - Primary   Relevant Medications   topiramate (TOPAMAX) 25 MG tablet   eletriptan (RELPAX) 40 MG tablet   metoprolol succinate (TOPROL-XL) 50 MG 24 hr tablet   Other Relevant Orders   Ambulatory referral to Neurology   Sedimentation rate   Ambulatory referral to Ophthalmology     Other   Short lasting unilateral neuralgiform headache with conjunctival injection and tearing (SUNCT), not intractable   Relevant Medications   topiramate (TOPAMAX) 25 MG tablet   eletriptan (RELPAX) 40 MG tablet   metoprolol succinate (TOPROL-XL) 50 MG 24  hr tablet   Other Relevant Orders   For home use only DME oxygen   Ambulatory referral to Neurology   Sedimentation rate   Ambulatory referral to Ophthalmology   Other Visit Diagnoses     Essential hypertension       Relevant Medications   metoprolol succinate (TOPROL-XL)  50 MG 24 hr tablet       Meds ordered this encounter  Medications   DISCONTD: metoprolol succinate (TOPROL-XL) 50 MG 24 hr tablet    Sig: Take 1 tablet (50 mg total) by mouth daily. Take with or immediately following a meal.    Dispense:  90 tablet    Refill:  3   topiramate (TOPAMAX) 25 MG tablet    Sig: Take 1 tablet (25 mg total) by mouth 2 (two) times daily.    Dispense:  60 tablet    Refill:  0   eletriptan (RELPAX) 40 MG tablet    Sig: May take 1/2 tablet for migraine. May repeat once in 2 hours if not relieved.    Dispense:  10 tablet    Refill:  2   metoprolol succinate (TOPROL-XL) 50 MG 24 hr tablet    Sig: Take 1 tablet (50 mg total) by mouth daily. Take with or immediately following a meal.    Dispense:  90 tablet    Refill:  3    Follow-up: Return in about 1 month (around 05/27/2021).  Cluster Ha vs Temporal Arteritis vs. Iritis.  Sed rate minimally elevated last visit.  We will check again.  I checked to rule out iritis.  He may take up to 80 mg of eletriptan daily.  He will do it in divided doses.  Have started Topamax 25 mg twice daily.  We will start metoprolol 50 mg daily.  Oxygen therapy as needed headaches.Out of work for 2 weeks.  Mliss Sax, MD

## 2021-04-26 NOTE — Telephone Encounter (Signed)
Patient coming in for OV  

## 2021-04-26 NOTE — Telephone Encounter (Signed)
OV today 

## 2021-04-28 ENCOUNTER — Telehealth: Payer: Self-pay | Admitting: Family Medicine

## 2021-04-28 NOTE — Telephone Encounter (Signed)
Spoke to Michelle

## 2021-04-29 NOTE — Telephone Encounter (Signed)
Spoke to [place], she was calling about an authorization and called [employer] and found out he didn't need one.  Dm/cma

## 2021-04-30 ENCOUNTER — Telehealth: Payer: Self-pay | Admitting: Family Medicine

## 2021-04-30 DIAGNOSIS — G44011 Episodic cluster headache, intractable: Secondary | ICD-10-CM

## 2021-04-30 NOTE — Telephone Encounter (Signed)
Pt called and wanted to know where he can pick his oxygen tank, and he wanted to follow up about the short term disability

## 2021-05-02 ENCOUNTER — Ambulatory Visit
Admission: RE | Admit: 2021-05-02 | Discharge: 2021-05-02 | Disposition: A | Discharge status: Home / Self Care | Payer: BC Managed Care – PPO | Source: Ambulatory Visit | Attending: Family Medicine | Admitting: Family Medicine

## 2021-05-02 ENCOUNTER — Other Ambulatory Visit: Payer: Self-pay

## 2021-05-02 DIAGNOSIS — G43909 Migraine, unspecified, not intractable, without status migrainosus: Secondary | ICD-10-CM | POA: Diagnosis not present

## 2021-05-02 DIAGNOSIS — G44059 Short lasting unilateral neuralgiform headache with conjunctival injection and tearing (SUNCT), not intractable: Secondary | ICD-10-CM

## 2021-05-02 DIAGNOSIS — J341 Cyst and mucocele of nose and nasal sinus: Secondary | ICD-10-CM | POA: Diagnosis not present

## 2021-05-02 MED ORDER — GADOBENATE DIMEGLUMINE 529 MG/ML IV SOLN
20.0000 mL | Freq: Once | INTRAVENOUS | Status: AC | PRN
  Administered 2021-05-02: 20 mL via INTRAVENOUS

## 2021-05-03 ENCOUNTER — Ambulatory Visit
Admission: RE | Admit: 2021-05-03 | Discharge: 2021-05-03 | Disposition: A | Discharge status: Home / Self Care | Payer: BC Managed Care – PPO | Source: Ambulatory Visit | Attending: Family Medicine | Admitting: Family Medicine

## 2021-05-03 ENCOUNTER — Other Ambulatory Visit: Payer: Self-pay

## 2021-05-03 DIAGNOSIS — G44011 Episodic cluster headache, intractable: Secondary | ICD-10-CM

## 2021-05-03 DIAGNOSIS — E041 Nontoxic single thyroid nodule: Secondary | ICD-10-CM | POA: Diagnosis not present

## 2021-05-03 NOTE — Telephone Encounter (Signed)
Please advise patient calling to see where to pick up oxygen. Can a DME be placed so I can fax over with notes for oxygen therapy for patient?

## 2021-05-03 NOTE — Progress Notes (Signed)
Error message

## 2021-05-05 DIAGNOSIS — J343 Hypertrophy of nasal turbinates: Secondary | ICD-10-CM | POA: Diagnosis not present

## 2021-05-05 DIAGNOSIS — G44011 Episodic cluster headache, intractable: Secondary | ICD-10-CM | POA: Diagnosis not present

## 2021-05-05 DIAGNOSIS — R0981 Nasal congestion: Secondary | ICD-10-CM | POA: Diagnosis not present

## 2021-05-11 ENCOUNTER — Encounter: Payer: Self-pay | Admitting: Family Medicine

## 2021-05-11 ENCOUNTER — Other Ambulatory Visit: Payer: Self-pay

## 2021-05-11 ENCOUNTER — Ambulatory Visit (INDEPENDENT_AMBULATORY_CARE_PROVIDER_SITE_OTHER): Payer: BC Managed Care – PPO | Admitting: Family Medicine

## 2021-05-11 VITALS — BP 124/80 | HR 87 | Temp 97.8°F | Ht 73.0 in | Wt 278.0 lb

## 2021-05-11 DIAGNOSIS — E049 Nontoxic goiter, unspecified: Secondary | ICD-10-CM

## 2021-05-11 DIAGNOSIS — H5711 Ocular pain, right eye: Secondary | ICD-10-CM | POA: Diagnosis not present

## 2021-05-11 DIAGNOSIS — G44011 Episodic cluster headache, intractable: Secondary | ICD-10-CM | POA: Diagnosis not present

## 2021-05-11 MED ORDER — PREDNISONE 10 MG (21) PO TBPK: ORAL_TABLET | ORAL | 0 refills | Status: DC

## 2021-05-11 MED ORDER — TOPIRAMATE 50 MG PO TABS: 50.0000 mg | ORAL_TABLET | Freq: Two times a day (BID) | ORAL | 1 refills | Status: DC

## 2021-05-11 NOTE — Progress Notes (Signed)
Established Patient Office Visit  Subjective:  Patient ID: Brian Kane, male    DOB: 1980-12-14  Age: 40 y.o. MRN: [id number]  CC:  Chief Complaint  Patient presents with   Follow-up    Follow up on headaches, per patient still having lots of headaches, also states that his head feels tender or burning sensation when he takes all of his meds.     HPI Brian Kane presents for follow-up of his headaches.  They are occurring 2-3 times daily.  There is watering and pain in OD.  Does not feel as though his vision has been affected much.  There is also pain behind the eye.  Ongoing allergy symptoms with postnasal drip.  He denies purulent rhinorrhea or discharge.  There is been no fever or chills.  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  He is scheduled visit with his dentist as well.  To see neurology next week.  Currently tolerating Topamax 50 mg twice daily.  He has been taking Aleve as well.  He feels some soreness in his scalp when he takes the Topamax but is otherwise tolerating it.  Has not obtained any oxygen yet.  History reviewed. No pertinent past medical history.  History reviewed. No pertinent surgical history.  History reviewed. No pertinent family history.  Social History   Socioeconomic History   Marital status: Married    Spouse name: Not on file   Number of children: Not on file   Years of education: Not on file   Highest education level: Not on file  Occupational History   Not on file  Tobacco Use   Smoking status: Some Days   Smokeless tobacco: Never  Substance and Sexual Activity   Alcohol use: Yes    Comment: 5-6 drinks 2 days/week   Drug use: Not Currently   Sexual activity: Yes  Other Topics Concern   Not on file  Social History Narrative   Not on file   Social Determinants of Health   Financial Resource Strain: Not on file  Food Insecurity: Not on file  Transportation Needs: Not on file   Physical Activity: Not on file  Stress: Not on file  Social Connections: Not on file  Intimate Partner Violence: Not on file    Outpatient Medications Prior to Visit  Medication Sig Dispense Refill   cetirizine (ZYRTEC) 10 MG tablet Take 1 tablet (10 mg total) by mouth daily. 30 tablet 11   eletriptan (RELPAX) 40 MG tablet May take 1/2 tablet for migraine. May repeat once in 2 hours if not relieved. 10 tablet 2   metoprolol succinate (TOPROL-XL) 50 MG 24 hr tablet Take 1 tablet (50 mg total) by mouth daily. Take with or immediately following a meal. 90 tablet 3   topiramate (TOPAMAX) 25 MG tablet Take 1 tablet (25 mg total) by mouth 2 (two) times daily. 60 tablet 0   No facility-administered medications prior to visit.    No Known Allergies  ROS Review of Systems  Constitutional:  Negative for chills, diaphoresis, fatigue, fever and unexpected weight change.  HENT:  Positive for congestion, postnasal drip and sneezing. Negative for rhinorrhea.   Eyes:  Positive for pain and discharge. Negative for itching.  Respiratory: Negative.    Cardiovascular: Negative.   Gastrointestinal: Negative.   Neurological:  Positive for headaches. Negative for facial asymmetry and numbness.  Hematological:  Does not bruise/bleed easily.  Psychiatric/Behavioral: Negative.  Objective:    Physical Exam Vitals and nursing note reviewed.  Constitutional:      General: He is not in acute distress.    Appearance: Normal appearance. He is not ill-appearing, toxic-appearing or diaphoretic.  HENT:     Head: Normocephalic and atraumatic.     Right Ear: Tympanic membrane, ear canal and external ear normal.     Left Ear: Tympanic membrane, ear canal and external ear normal.     Mouth/Throat:     Mouth: Mucous membranes are moist.     Pharynx: Oropharynx is clear. No oropharyngeal exudate or posterior oropharyngeal erythema.  Eyes:     General: Visual field deficit (difficulty with visual field  testing with od.) present. No scleral icterus.       Right eye: No discharge.        Left eye: No discharge.     Extraocular Movements: Extraocular movements intact.     Conjunctiva/sclera: Conjunctivae normal.     Pupils: Pupils are equal, round, and reactive to light.  Cardiovascular:     Rate and Rhythm: Normal rate and regular rhythm.  Pulmonary:     Effort: Pulmonary effort is normal.     Breath sounds: Normal breath sounds.  Abdominal:     General: Bowel sounds are normal.  Neurological:     Mental Status: He is alert.     Cranial Nerves: No dysarthria or facial asymmetry.     Sensory: No sensory deficit.    BP 124/80 (BP Location: Left Arm, Patient Position: Sitting, Cuff Size: Large)   Pulse 87   Temp 97.8 F (36.6 C) (Temporal)   Ht 6\' 1"  (1.854 m)   Wt 278 lb (126.1 kg)   SpO2 98%   BMI 36.68 kg/m  Wt Readings from Last 3 Encounters:  05/11/21 278 lb (126.1 kg)  04/26/21 282 lb 3.2 oz (128 kg)  04/12/21 284 lb 9.6 oz (129.1 kg)     Health Maintenance Due  Topic Date Due   Pneumococcal Vaccine 47-32 Years old (1 - PCV) Never done   HIV Screening  Never done   Hepatitis C Screening  Never done    There are no preventive care reminders to display for this patient.  Lab Results  Component Value Date   TSH 1.05 04/12/2021   Lab Results  Component Value Date   WBC 15.7 (H) 04/12/2021   HGB 16.4 04/12/2021   HCT 49.3 04/12/2021   MCV 93.0 04/12/2021   PLT 301.0 04/12/2021   Lab Results  Component Value Date   NA 136 04/12/2021   K 4.2 04/12/2021   CO2 25 04/12/2021   GLUCOSE 78 04/12/2021   BUN 24 (H) 04/12/2021   CREATININE 1.20 04/12/2021   BILITOT 0.3 04/12/2021   ALKPHOS 69 04/12/2021   AST 15 04/12/2021   ALT 18 04/12/2021   PROT 7.2 04/12/2021   ALBUMIN 4.2 04/12/2021   CALCIUM 9.3 04/12/2021   GFR 75.39 04/12/2021   No results found for: CHOL No results found for: HDL No results found for: LDLCALC No results found for: TRIG No  results found for: CHOLHDL No results found for: 04/14/2021    Assessment & Plan:   Problem List Items Addressed This Visit       Endocrine   Thyroid goiter     Nervous and Auditory   Intractable episodic cluster headache - Primary   Relevant Medications   predniSONE (STERAPRED UNI-PAK 21 TAB) 10 MG (21) TBPK tablet   topiramate (  TOPAMAX) 50 MG tablet     Other   Pain of right eye   Relevant Orders   Ambulatory referral to Ophthalmology    Meds ordered this encounter  Medications   predniSONE (STERAPRED UNI-PAK 21 TAB) 10 MG (21) TBPK tablet    Sig: Take 6 today, 5 tomorrow, 4 the next day and then 3, 2, 1 and stop    Dispense:  21 tablet    Refill:  0   topiramate (TOPAMAX) 50 MG tablet    Sig: Take 1 tablet (50 mg total) by mouth 2 (two) times daily.    Dispense:  60 tablet    Refill:  1     Follow-up: Return if symptoms worsen or fail to improve.  Will start another 6-day prednisone Dosepak.  Asked patient to discontinue the Aleve.  Continue Topamax 50 mg twice daily.  Relpax as needed.  CMA is checking on oxygen therapy.  Urgent ophthalmology consultation requested.  Has follow-up with neurology next week.  Ongoing follow-up with his headaches will be with neurology.   Mliss Sax, MD

## 2021-05-19 ENCOUNTER — Telehealth: Payer: Self-pay | Admitting: Family Medicine

## 2021-05-19 NOTE — Telephone Encounter (Signed)
Pt is checking on the status of his fmla paperwork. Please call pt back at [phone].

## 2021-05-19 NOTE — Telephone Encounter (Signed)
Patient aware that forms were received waiting to be filled out by Provider then they will be faxed over.

## 2021-05-20 ENCOUNTER — Ambulatory Visit (INDEPENDENT_AMBULATORY_CARE_PROVIDER_SITE_OTHER): Payer: BC Managed Care – PPO | Admitting: Psychiatry

## 2021-05-20 ENCOUNTER — Encounter: Payer: Self-pay | Admitting: Psychiatry

## 2021-05-20 VITALS — BP 116/74 | HR 70 | Ht 72.0 in | Wt 283.5 lb

## 2021-05-20 DIAGNOSIS — Z5181 Encounter for therapeutic drug level monitoring: Secondary | ICD-10-CM

## 2021-05-20 DIAGNOSIS — G44019 Episodic cluster headache, not intractable: Secondary | ICD-10-CM | POA: Diagnosis not present

## 2021-05-20 MED ORDER — SUMATRIPTAN SUCCINATE 6 MG/0.5ML ~~LOC~~ SOAJ: 6.0000 mg | SUBCUTANEOUS | 11 refills | Status: DC | PRN

## 2021-05-20 MED ORDER — INDOMETHACIN 25 MG PO CAPS: 25.0000 mg | ORAL_CAPSULE | Freq: Three times a day (TID) | ORAL | 2 refills | Status: DC | PRN

## 2021-05-20 NOTE — Patient Instructions (Addendum)
Use Oxygen and injectable Imitrex as needed at the onset of cluster headaches.  Can take indomethacin up to 3 times a day as needed for headaches EKG   What is a cluster headache? A rare and disabling headache disorder (0.1% of the population) Typical age of onset: 75-41 years old, more common in men More common in smokers or those exposed to smoke Recurrent attacks of intense, one sided headaches associated with cranial autonomic features( such as tearing of the eye, drooping of the eye, change in pupil size, sweating, dripping of the nose) ~15% will have cluster headaches switch sides of the head that is involved Attacks can last 15-180 minutes Attacks can occur 1-8x/day 70% of patients have attacks during sleep 30% of patients have pain between attacks Patients are normal between bouts/attacks Common triggers: alcohol, histamine, nitrates, vasodilating agents like Viagra

## 2021-05-20 NOTE — Progress Notes (Signed)
Referring:  Libby Maw, MD [address] [place],  [place] [address]  PCP: Libby Maw, MD  Neurology was asked to evaluate Brian Kane, a 40 year old male for a chief complaint of headaches.  Our recommendations of care will be communicated by shared medical record.    CC:  headaches  HPI:  Medical co-morbidities: smoker, HTN  The patient presents for evaluation of headaches which began 4 years ago. They come in spells which last 3 months at a time. This current spell started 2 months ago. Last cycle was 2 years ago.  Headaches are described as right sided retro-orbital pain associated with right ptosis, lacrimation, eye redness, rhinorrhea, and nasal congestion. Occurs 4-5 times per day. Can last several hours at a time. Headaches used to be the same time every day, but now they seem to come at random. Most common in the early morning.  He started eletriptan for rescue which initially did help, but seems less effective lately. Takes at least 30-45 minutes to start working.  He does note that BC powder relieves the headache more than anything else. Taking a prednisone taper which hasn't made much of a difference so far. He has been trying oxygen which does help sometimes. Topamax for prevention did not help so he stopped taking it.   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 Was evaluated by ENT who did not think the retention cyst was severe enough to be causing his symptoms.  Headache History: Onset: 4 years ago Triggers: smell, alcohol Most common time of day for headache to begin: early morning Aura: no Location: right eye Associated Symptoms:  Photophobia: no  Phonophobia: no  Nausea: no Worse with activity?: no Duration of headaches: hours  Headache days per month: 30 Headache free days per month: 0  Current Treatment: Abortive Relpax BC powder  Preventative none  Prior  Therapies                                 Topamax 50 mg BID Relpax 20 mg PRN Prednisone taper   LABS: 04/12/21: ESR 26, CBC with WBC 15.7, TSH wnl  IMAGING:  MRI brain 05/02/21: unremarkable other than a small mucous retention cyst in maxillary sinuses  Imaging independently reviewed on May 20, 2021   Current Outpatient Medications on File Prior to Visit  Medication Sig Dispense Refill   cetirizine (ZYRTEC) 10 MG tablet Take 1 tablet (10 mg total) by mouth daily. 30 tablet 11   metoprolol succinate (TOPROL-XL) 50 MG 24 hr tablet Take 1 tablet (50 mg total) by mouth daily. Take with or immediately following a meal. 90 tablet 3   predniSONE (STERAPRED UNI-PAK 21 TAB) 10 MG (21) TBPK tablet Take 6 today, 5 tomorrow, 4 the next day and then 3, 2, 1 and stop 21 tablet 0   topiramate (TOPAMAX) 50 MG tablet Take 1 tablet (50 mg total) by mouth 2 (two) times daily. (Patient not taking: Reported on 05/20/2021) 60 tablet 1   No current facility-administered medications on file prior to visit.     Allergies: No Known Allergies  Family History: Family History  Problem Relation Age of Onset   Parkinsonism Father    Past Medical History: Past Medical History:  Diagnosis Date   Headache     Past Surgical History History reviewed. No pertinent surgical history.  Social History: Social  History   Tobacco Use   Smoking status: Some Days   Smokeless tobacco: Never  Substance Use Topics   Alcohol use: Yes    Comment: 5-6 drinks 2 days/week   Drug use: Not Currently    ROS: Negative for fevers, chills. Positive for headaches, ptosis, lacrimation, rhinorrhea, nasal congestion. All other systems reviewed and negative unless stated otherwise in HPI.   Physical Exam:   Vital Signs: BP 116/74   Pulse 70   Ht 6' (1.829 m)   Wt 283 lb 8 oz (128.6 kg)   SpO2 98%   BMI 38.45 kg/m  GENERAL: well appearing,in no acute distress,alert SKIN:  Color, texture, turgor normal. No  rashes or lesions HEAD:  Normocephalic/atraumatic. CV:  RRR RESP: Normal respiratory effort MSK: no tenderness to palpation over occiput, neck, or shoulders  NEUROLOGICAL: Mental Status: Alert, oriented to person, place and time,Follows commands Cranial Nerves: PERRL,visual fields intact to confrontation,extraocular movements intact,facial sensation intact,no facial droop or ptosis,hearing intact to finger rub bilaterally,no dysarthria,palate elevate symmetrically,tongue protrudes midline,shoulder shrug intact and symmetric Motor: muscle strength 5/5 both upper and lower extremities,no drift, normal tone Reflexes: 2+ throughout Sensation: intact to light touch all 4 extremities Coordination: Finger-to- nose-finger intact bilaterally,Heel-to-shin intact bilaterally Gait: normal-based   IMPRESSION: 40 year old male with a history of HTN who presents for evaluation of headaches which began 2 months ago. They are always right sided with ipsilateral autonomic symptoms. Headache pattern is most consistent with episodic cluster headaches. Will switch Relpax to subq Imitrex which is faster acting and can provide quicker relief. Interestingly he does note resolution of his headaches with BC powder. Will prescribe PRN indomethacin and see if this works for his headaches. If he does note significant improvement would consider a full indomethacin trial up to 75 mg TID. First line for cluster prevention is verapamil. He is currently taking metoprolol for HTN which he started ~1 month ago. Will obtain EKG, then may consider switch from metoprolol to verapamil. Otherwise could consider Emgality for prevention.  PLAN: -EKG -continue prednisone taper (2 days left) -Rescue: Imitrex subq and oxygen PRN. Trial of Indomethacin 25 mg TID PRN -next steps: Consider verapamil vs Emgality for prevention pending EKG. Consider full indomethacin trial if headaches respond to PRN indomethacin   I spent a total of 45  minutes chart reviewing and counseling the patient. Headache education was done. Discussed treatment options including preventive and acute medications, natural supplements, and physical therapy. Discussed medication side effects, adverse reactions and drug interactions. Written educational materials and patient instructions outlining all of the above were given.  Follow-up: 3 months   Genia Harold, MD 05/20/2021   9:47 AM

## 2021-05-21 DIAGNOSIS — J343 Hypertrophy of nasal turbinates: Secondary | ICD-10-CM | POA: Diagnosis not present

## 2021-05-21 DIAGNOSIS — J3489 Other specified disorders of nose and nasal sinuses: Secondary | ICD-10-CM | POA: Diagnosis not present

## 2021-05-21 DIAGNOSIS — J342 Deviated nasal septum: Secondary | ICD-10-CM | POA: Diagnosis not present

## 2021-05-25 ENCOUNTER — Telehealth: Payer: Self-pay | Admitting: Family Medicine

## 2021-05-25 ENCOUNTER — Encounter: Payer: Self-pay | Admitting: Family Medicine

## 2021-05-25 NOTE — Telephone Encounter (Signed)
Patient also sent message via Mychart. Responded to message informed patient that forms were received and will be faxed as soon as Dr. Doreene Burke completes.

## 2021-05-28 ENCOUNTER — Encounter: Payer: Self-pay | Admitting: Family Medicine

## 2021-05-28 NOTE — Telephone Encounter (Signed)
Returned patients call went over letters for patient to return to work.

## 2021-05-28 NOTE — Telephone Encounter (Signed)
Pt is wanting a call back concerning some questions about his fmla paperwork. Please advise pt at [phone].

## 2021-06-21 ENCOUNTER — Ambulatory Visit: Payer: BC Managed Care – PPO | Admitting: Family Medicine

## 2021-09-29 ENCOUNTER — Ambulatory Visit (INDEPENDENT_AMBULATORY_CARE_PROVIDER_SITE_OTHER): Payer: BC Managed Care – PPO | Admitting: Nurse Practitioner

## 2021-09-29 ENCOUNTER — Other Ambulatory Visit: Payer: Self-pay

## 2021-09-29 ENCOUNTER — Encounter: Payer: Self-pay | Admitting: Nurse Practitioner

## 2021-09-29 ENCOUNTER — Ambulatory Visit (INDEPENDENT_AMBULATORY_CARE_PROVIDER_SITE_OTHER): Payer: BC Managed Care – PPO

## 2021-09-29 VITALS — BP 150/90 | HR 108 | Temp 96.8°F | Ht 72.0 in | Wt 286.4 lb

## 2021-09-29 DIAGNOSIS — M79672 Pain in left foot: Secondary | ICD-10-CM

## 2021-09-29 DIAGNOSIS — R030 Elevated blood-pressure reading, without diagnosis of hypertension: Secondary | ICD-10-CM | POA: Diagnosis not present

## 2021-09-29 DIAGNOSIS — S92302A Fracture of unspecified metatarsal bone(s), left foot, initial encounter for closed fracture: Secondary | ICD-10-CM

## 2021-09-29 MED ORDER — KETOROLAC TROMETHAMINE 60 MG/2ML IM SOLN
30.0000 mg | Freq: Once | INTRAMUSCULAR | Status: AC
  Administered 2021-09-29: 30 mg via INTRAMUSCULAR

## 2021-09-29 NOTE — Assessment & Plan Note (Signed)
BP elevated today, 150/90. Most likely related to acute pain. Continue to monitor blood pressure at next visit.  ?

## 2021-09-29 NOTE — Assessment & Plan Note (Addendum)
Acute pain to top of his left foot since Monday near the navicular area.  He does not remember injuring it.  He is unable to walk without significant pain, he is using crutches to walk into the exam room.  There is swelling, bruising, and tenderness at this location.  We will check a x-ray of his foot, check uric acid to rule out gout.  We will give 30 mg IM of Toradol today.  Encouraged him to elevate his foot when sitting and take it easy for the next few days.  He can use ice and alternate ibuprofen and Tylenol as needed for pain.  Will refer to podiatry if pain is still ongoing.  Work note given for next 3 days out of work.  Follow-up if symptoms worsen or do not improve. ?

## 2021-09-29 NOTE — Patient Instructions (Signed)
It was great to see you! ? ?We are checking x-rays of your foot today. We are also going to check your blood work for gout.  ? ?Keep your foot propped up when sitting with no shoe/sock on. You can use ice with a towel for 10-15 minutes 4 times a day and alternate ibuprofen and tylenol. I will place a referral if you are still having pain or if your x-ray shows anything.  ? ?Let's follow-up if your symptoms don't improve or worsen.  ? ?Take care, ? ?Rodman Pickle, NP ? ?

## 2021-09-29 NOTE — Progress Notes (Signed)
? ?Acute Office Visit ? ?Subjective:  ? ? Patient ID: Brian Kane, male    DOB: 07/13/1981, 41 y.o.   MRN: [id number] ? ?Chief Complaint  ?Patient presents with  ? Foot Pain  ?  Pt c/o sharp pain with left foot since Monday.  ? ? ?HPI ?Patient is in today for pain in his left foot since Monday.  ? ?FOOT PAIN ? ?Duration: days ?Involved foot: left ?Mechanism of injury: unknown ?Location: top of his foot ?Onset: sudden  ?Severity: 10/10  ?Quality:  sore, sharp, throbbing ?Frequency: constant ?Radiation: yes up his leg when walking ?Aggravating factors: weight bearing, walking, running, and movement  ?Alleviating factors: nothing  ?Status: worse ?Treatments attempted: rest, APAP, and ibuprofen  ?Relief with NSAIDs?:  no ?Weakness with weight bearing or walking: yes ?Morning stiffness: yes ?Swelling: no ?Redness: no ?Bruising: no ?Paresthesias / decreased sensation: no  ?Fevers:no ? ? ?Past Medical History:  ?Diagnosis Date  ? Headache   ? ? ?History reviewed. No pertinent surgical history. ? ?Family History  ?Problem Relation Age of Onset  ? Parkinsonism Father   ? ? ?Social History  ? ?Socioeconomic History  ? Marital status: Married  ?  Spouse name: Not on file  ? Number of children: Not on file  ? Years of education: Not on file  ? Highest education level: Associate degree: academic program  ?Occupational History  ? Not on file  ?Tobacco Use  ? Smoking status: Some Days  ? Smokeless tobacco: Never  ?Substance and Sexual Activity  ? Alcohol use: Yes  ?  Comment: 5-6 drinks 2 days/week  ? Drug use: Not Currently  ? Sexual activity: Yes  ?Other Topics Concern  ? Not on file  ?Social History Narrative  ? Right handed  ? Caffeine- 1-2 cups per day  ? Lives at home alone   ? ?Social Determinants of Health  ? ?Financial Resource Strain: Not on file  ?Food Insecurity: Not on file  ?Transportation Needs: Not on file  ?Physical Activity: Not on file  ?Stress: Not on file  ?Social Connections: Not on file  ?Intimate  Partner Violence: Not on file  ? ? ?Outpatient Medications Prior to Visit  ?Medication Sig Dispense Refill  ? cetirizine (ZYRTEC) 10 MG tablet Take 1 tablet (10 mg total) by mouth daily. 30 tablet 11  ? indomethacin (INDOCIN) 25 MG capsule Take 1 capsule (25 mg total) by mouth 3 (three) times daily as needed. For headaches 90 capsule 2  ? metoprolol succinate (TOPROL-XL) 50 MG 24 hr tablet Take 1 tablet (50 mg total) by mouth daily. Take with or immediately following a meal. 90 tablet 3  ? predniSONE (STERAPRED UNI-PAK 21 TAB) 10 MG (21) TBPK tablet Take 6 today, 5 tomorrow, 4 the next day and then 3, 2, 1 and stop 21 tablet 0  ? SUMAtriptan (IMITREX STATDOSE SYSTEM) 6 MG/0.5ML SOAJ Inject 6 mg into the skin as needed (for cluster headache). 15 mL 11  ? topiramate (TOPAMAX) 50 MG tablet Take 1 tablet (50 mg total) by mouth 2 (two) times daily. (Patient not taking: Reported on 05/20/2021) 60 tablet 1  ? ?No facility-administered medications prior to visit.  ? ? ?No Known Allergies ? ?Review of Systems ?See pertinent positives and negatives per HPI. ?   ?Objective:  ?  ?Physical Exam ?Vitals and nursing note reviewed.  ?Constitutional:   ?   Appearance: Normal appearance.  ?HENT:  ?   Head: Normocephalic.  ?Eyes:  ?  Conjunctiva/sclera: Conjunctivae normal.  ?Cardiovascular:  ?   Pulses: Normal pulses.  ?Pulmonary:  ?   Effort: Pulmonary effort is normal.  ?Musculoskeletal:     ?   General: Swelling and tenderness present.  ?   Cervical back: Normal range of motion.  ?   Comments: Near navicular area on top of fot  ?Skin: ?   General: Skin is warm.  ?   Findings: Bruising (top of foot) present.  ?Neurological:  ?   General: No focal deficit present.  ?   Mental Status: He is alert and oriented to person, place, and time.  ?Psychiatric:     ?   Mood and Affect: Mood normal.     ?   Behavior: Behavior normal.     ?   Thought Content: Thought content normal.     ?   Judgment: Judgment normal.  ? ? ?BP (!) 150/90 (BP  Location: Left Arm, Patient Position: Sitting, Cuff Size: Large)   Pulse (!) 108   Temp (!) 96.8 ?F (36 ?C) (Temporal)   Ht 6' (1.829 m)   Wt 286 lb 6.4 oz (129.9 kg)   SpO2 98%   BMI 38.84 kg/m?  ?Wt Readings from Last 3 Encounters:  ?09/29/21 286 lb 6.4 oz (129.9 kg)  ?05/20/21 283 lb 8 oz (128.6 kg)  ?05/11/21 278 lb (126.1 kg)  ? ? ?Health Maintenance Due  ?Topic Date Due  ? HIV Screening  Never done  ? Hepatitis C Screening  Never done  ? ? ?There are no preventive care reminders to display for this patient. ? ? ?Lab Results  ?Component Value Date  ? TSH 1.05 04/12/2021  ? ?Lab Results  ?Component Value Date  ? WBC 15.7 (H) 04/12/2021  ? HGB 16.4 04/12/2021  ? HCT 49.3 04/12/2021  ? MCV 93.0 04/12/2021  ? PLT 301.0 04/12/2021  ? ?Lab Results  ?Component Value Date  ? NA 136 04/12/2021  ? K 4.2 04/12/2021  ? CO2 25 04/12/2021  ? GLUCOSE 78 04/12/2021  ? BUN 24 (H) 04/12/2021  ? CREATININE 1.20 04/12/2021  ? BILITOT 0.3 04/12/2021  ? ALKPHOS 69 04/12/2021  ? AST 15 04/12/2021  ? ALT 18 04/12/2021  ? PROT 7.2 04/12/2021  ? ALBUMIN 4.2 04/12/2021  ? CALCIUM 9.3 04/12/2021  ? GFR 75.39 04/12/2021  ? ?No results found for: CHOL ?No results found for: HDL ?No results found for: LDLCALC ?No results found for: TRIG ?No results found for: CHOLHDL ?No results found for: HGBA1C ? ?   ?Assessment & Plan:  ? ?Problem List Items Addressed This Visit   ? ?  ? Other  ? Elevated BP without diagnosis of hypertension  ?  BP elevated today, 150/90. Most likely related to acute pain. Continue to monitor blood pressure at next visit.  ?  ?  ? Left foot pain - Primary  ?  Acute pain to top of his left foot since Monday near the navicular area.  He does not remember injuring it.  He is unable to walk without significant pain, he is using crutches to walk into the exam room.  There is swelling, bruising, and tenderness at this location.  We will check a x-ray of his foot, check uric acid to rule out gout.  We will give 30 mg IM of  Toradol today.  Encouraged him to elevate his foot when sitting and take it easy for the next few days.  He can use ice and alternate ibuprofen  and Tylenol as needed for pain.  Will refer to podiatry if pain is still ongoing.  Work note given for next 3 days out of work.  Follow-up if symptoms worsen or do not improve. ?  ?  ? Relevant Orders  ? DG Foot Complete Left  ? Uric acid  ? ? ? ?Meds ordered this encounter  ?Medications  ? ketorolac (TORADOL) injection 30 mg  ? ? ? ?Gerre Scull, NP ? ?

## 2021-09-30 LAB — URIC ACID: Uric Acid, Serum: 7.4 mg/dL (ref 4.0–7.8)

## 2021-10-04 ENCOUNTER — Encounter: Payer: Self-pay | Admitting: Nurse Practitioner

## 2021-10-04 NOTE — Progress Notes (Signed)
Lab results, provider instructions and comments viewed by patient via mychart.

## 2021-10-04 NOTE — Addendum Note (Signed)
Addended by: Rodman Pickle A on: 10/04/2021 08:06 AM ? ? Modules accepted: Orders ? ?

## 2021-10-05 ENCOUNTER — Other Ambulatory Visit: Payer: Self-pay

## 2021-10-05 ENCOUNTER — Ambulatory Visit (INDEPENDENT_AMBULATORY_CARE_PROVIDER_SITE_OTHER): Payer: BC Managed Care – PPO

## 2021-10-05 ENCOUNTER — Ambulatory Visit (INDEPENDENT_AMBULATORY_CARE_PROVIDER_SITE_OTHER): Payer: BC Managed Care – PPO | Admitting: Podiatry

## 2021-10-05 ENCOUNTER — Encounter: Payer: Self-pay | Admitting: Podiatry

## 2021-10-05 DIAGNOSIS — S92325A Nondisplaced fracture of second metatarsal bone, left foot, initial encounter for closed fracture: Secondary | ICD-10-CM | POA: Diagnosis not present

## 2021-10-05 DIAGNOSIS — S93622A Sprain of tarsometatarsal ligament of left foot, initial encounter: Secondary | ICD-10-CM | POA: Diagnosis not present

## 2021-10-05 DIAGNOSIS — S9032XA Contusion of left foot, initial encounter: Secondary | ICD-10-CM

## 2021-10-05 NOTE — Patient Instructions (Signed)
Call [facility] at [phone] to schedule your MRI ° °

## 2021-10-06 ENCOUNTER — Other Ambulatory Visit: Payer: Self-pay | Admitting: Rheumatology

## 2021-10-08 ENCOUNTER — Encounter: Payer: Self-pay | Admitting: Podiatry

## 2021-10-08 NOTE — Progress Notes (Signed)
?  Subjective:  ?Patient ID: Brian Kane, male    DOB: 1980-10-31,  MRN: [id number] ? ?Chief Complaint  ?Patient presents with  ? Fracture  ?  new pt-closed fracture base of metatarsal bone L foot-non req-[name], NP refer  ? ? ?41 y.o. male presents with the above complaint. History confirmed with patient.  Woke up Monday with foot pain does not recall any specific injury has not been able to put weight on it until he went to the ER on Friday and had an anti-inflammatory injection in his arm.  They took x-rays and referred him to [place].  Able to pull a bit more weight on it now. ? ?Objective:  ?Physical Exam: ?warm, good capillary refill, no trophic changes or ulcerative lesions, normal DP and PT pulses, normal sensory exam, and left midfoot pain over the first tarsometatarsal joint there is an area of palpable swelling and fullness here ? ?Radiographs: ?Multiple views x-ray of left foot taken today show a possible fracture or osteophyte at the base of the second metatarsal and the Lisfranc interval with slight displacement of the second metatarsal ?Assessment:  ? ?1. Lisfranc's sprain, left, initial encounter   ?2. Closed nondisplaced fracture of second metatarsal bone of left foot, initial encounter   ? ? ? ?Plan:  ?Patient was evaluated and treated and all questions answered. ? ?I discussed with him he has likely a Lisfranc sprain and possible fracture of the base of the second metatarsal.  I recommended an MRI to evaluate further if this requires surgical intervention.  The MRIs been ordered for surgical planning.  Recommend he be WBAT in a cam boot.  RICE protocol reviewed.  CAM boot was dispensed today.  I will see him back after the MRI. ? ?Return for after MRI to review.  ? ?

## 2021-10-26 ENCOUNTER — Ambulatory Visit
Admission: RE | Admit: 2021-10-26 | Discharge: 2021-10-26 | Disposition: A | Discharge status: Home / Self Care | Payer: BC Managed Care – PPO | Source: Ambulatory Visit | Attending: Podiatry | Admitting: Podiatry

## 2021-10-26 DIAGNOSIS — S92325A Nondisplaced fracture of second metatarsal bone, left foot, initial encounter for closed fracture: Secondary | ICD-10-CM

## 2021-10-26 DIAGNOSIS — S93622A Sprain of tarsometatarsal ligament of left foot, initial encounter: Secondary | ICD-10-CM

## 2021-10-26 DIAGNOSIS — M79676 Pain in unspecified toe(s): Secondary | ICD-10-CM

## 2021-10-26 DIAGNOSIS — S99922A Unspecified injury of left foot, initial encounter: Secondary | ICD-10-CM | POA: Diagnosis not present

## 2021-11-09 ENCOUNTER — Telehealth: Payer: Self-pay | Admitting: Podiatry

## 2021-11-09 NOTE — Telephone Encounter (Signed)
Brian Kane is scheduled to come in on 5/18 for MRI follow up. He is scheduled to rtw 4/30, he is requesting extension on leave, please advise? ?

## 2021-12-02 ENCOUNTER — Ambulatory Visit (INDEPENDENT_AMBULATORY_CARE_PROVIDER_SITE_OTHER): Payer: BC Managed Care – PPO

## 2021-12-02 ENCOUNTER — Ambulatory Visit (INDEPENDENT_AMBULATORY_CARE_PROVIDER_SITE_OTHER): Payer: BC Managed Care – PPO | Admitting: Podiatry

## 2021-12-02 DIAGNOSIS — S92325A Nondisplaced fracture of second metatarsal bone, left foot, initial encounter for closed fracture: Secondary | ICD-10-CM

## 2021-12-02 DIAGNOSIS — S92325D Nondisplaced fracture of second metatarsal bone, left foot, subsequent encounter for fracture with routine healing: Secondary | ICD-10-CM

## 2021-12-06 ENCOUNTER — Encounter: Payer: Self-pay | Admitting: Podiatry

## 2021-12-06 NOTE — Progress Notes (Signed)
Subjective:  Patient ID: Brian Kane, male    DOB: 04-Jan-1981,  MRN: [id number]  Chief Complaint  Patient presents with   Follow-up    f/u L closed fracture    41 y.o. male presents with the above complaint. History confirmed with patient.  He says it is feeling much better the boot has been helpful.  Objective:  Physical Exam: warm, good capillary refill, no trophic changes or ulcerative lesions, normal DP and PT pulses, normal sensory exam, and no pain or instability over the midfoot there is a palpable dorsal spurring of the second metatarsal base  Radiographs: Multiple views x-ray of left foot taken today show a possible fracture or osteophyte at the base of the second metatarsal and the Lisfranc interval with slight displacement of the second metatarsal  Study Result  Narrative & Impression  CLINICAL DATA:  Foot trauma. Lisfranc ligament injury suspected. X-ray equivocal. Left midfoot pain radiating to toes for 6 weeks.   EXAM: MRI OF THE LEFT FOOT WITHOUT CONTRAST   TECHNIQUE: Multiplanar, multisequence MR imaging of the left forefoot was performed. No intravenous contrast was administered.   COMPARISON:  weight-bearing left foot radiographs 10/05/2021, 09/29/2021   FINDINGS: Bones/Joint/Cartilage   Note is made on 09/29/2021 radiographs of a small apparent spur versus avulsion cortical fragment at the medial aspect of the proximal shaft of second metatarsal, favored to be dorsal on lateral view. On the current MRI, this is localized as a chronic well corticated degenerative spur at the dorsal medial aspect of second metatarsal (coronal series 7, image 8, axial series 4, image 33, sagittal image 15). There does not appear to be a separated avulsion cortical fragment. This is at the far dorsal aspect of the Lisfranc interval and may reflect remote injury to the dorsal component of the Lisfranc ligament complex, however this ligament appears  intact (coronal series 5, images 32-35) inserts on this spur. The interosseous and plantar components of the Lisfranc ligament complex also appear intact.   There is mild marrow edema within the junction of the base and proximal shaft of second metatarsal (coronal series 8, image 10; sagittal series 9, image 17).   Moderate great toe metatarsophalangeal joint space narrowing, cartilage thinning, plantar and distal metatarsal head subchondral cystic change, and mild peripheral degenerative osteophytosis.   Mild cartilage thinning of the interphalangeal joints diffusely.   Ligaments   Please see above discussion of the Lisfranc ligament complex.   The plantar plates are intact.   Muscles and Tendons   The visualized flexor and extensor tendons appear intact. Normal muscle size and signal.   Soft tissues   No joint effusion.   IMPRESSION: The bone density seen bordering the medial base of the second metatarsal on prior radiographs is seen as an intact spur extending dorsally and medially from the dorsal medial aspect of the base of the second metatarsal. The Lisfranc ligament complex appears intact.   There is mild marrow edema within the junction of the base and proximal shaft of second metatarsal possibly stress related change. No acute fracture is seen.     Electronically Signed   By: Neita Garnet M.D.   On: 10/26/2021 18:58   Assessment:   1. Closed nondisplaced fracture of second metatarsal bone of left foot with routine healing, subsequent encounter      Plan:  Patient was evaluated and treated and all questions answered.  Today we reviewed his MRI report and findings in detail.  Discussed with him that this  is likely a sequela of an old injury that been there for some time.  He has no gross instability.  No pain on palpation today and is reduce the inflammation with time in the CAM boot.  We discussed excision of this area.  I do not think he requires a  Lisfranc arthrodesis at this point as he has no gross instability in the majority of the Lisfranc ligament appears to be intact and in good position with no gross deformity clinically or on x-ray.  Likely will need arthrodesis at some point in his lifetime.  Discussed alternate lacing to avoid compression over the dorsal spur.  I will see him back as needed.  Return if ready to schedule surgery.

## 2021-12-14 ENCOUNTER — Encounter: Payer: Self-pay | Admitting: Podiatry

## 2021-12-28 ENCOUNTER — Ambulatory Visit (INDEPENDENT_AMBULATORY_CARE_PROVIDER_SITE_OTHER): Payer: BC Managed Care – PPO | Admitting: Podiatry

## 2021-12-28 DIAGNOSIS — M898X7 Other specified disorders of bone, ankle and foot: Secondary | ICD-10-CM

## 2021-12-30 ENCOUNTER — Telehealth: Payer: Self-pay | Admitting: Podiatry

## 2021-12-30 NOTE — Telephone Encounter (Signed)
I am holding for 12/28/21 office note for pt. Disability.

## 2021-12-31 ENCOUNTER — Encounter: Payer: Self-pay | Admitting: Podiatry

## 2021-12-31 NOTE — Progress Notes (Signed)
Subjective:  Patient ID: Brian Kane, male    DOB: [birth year],  MRN: [id number]  Chief Complaint  Patient presents with   Fracture     SURGERY CONSULT    40 y.o. male presents with the above complaint. History confirmed with patient.  He returns for follow-up its still painful and has worsened since he went back to work and is in his boots  Objective:  Physical Exam: warm, good capillary refill, no trophic changes or ulcerative lesions, normal DP and PT pulses, normal sensory exam, and no pain or instability over the midfoot there is a palpable dorsal spurring of the second metatarsal base  Radiographs: Multiple views x-ray of left foot taken today show a possible fracture or osteophyte at the base of the second metatarsal and the Lisfranc interval with slight displacement of the second metatarsal  Study Result  Narrative & Impression  CLINICAL DATA:  Foot trauma. Lisfranc ligament injury suspected. X-ray equivocal. Left midfoot pain radiating to toes for 6 weeks.   EXAM: MRI OF THE LEFT FOOT WITHOUT CONTRAST   TECHNIQUE: Multiplanar, multisequence MR imaging of the left forefoot was performed. No intravenous contrast was administered.   COMPARISON:  weight-bearing left foot radiographs 10/05/2021, 09/29/2021   FINDINGS: Bones/Joint/Cartilage   Note is made on 09/29/2021 radiographs of a small apparent spur versus avulsion cortical fragment at the medial aspect of the proximal shaft of second metatarsal, favored to be dorsal on lateral view. On the current MRI, this is localized as a chronic well corticated degenerative spur at the dorsal medial aspect of second metatarsal (coronal series 7, image 8, axial series 4, image 33, sagittal image 15). There does not appear to be a separated avulsion cortical fragment. This is at the far dorsal aspect of the Lisfranc interval and may reflect remote injury to the dorsal component of the Lisfranc ligament complex,  however this ligament appears intact (coronal series 5, images 32-35) inserts on this spur. The interosseous and plantar components of the Lisfranc ligament complex also appear intact.   There is mild marrow edema within the junction of the base and proximal shaft of second metatarsal (coronal series 8, image 10; sagittal series 9, image 17).   Moderate great toe metatarsophalangeal joint space narrowing, cartilage thinning, plantar and distal metatarsal head subchondral cystic change, and mild peripheral degenerative osteophytosis.   Mild cartilage thinning of the interphalangeal joints diffusely.   Ligaments   Please see above discussion of the Lisfranc ligament complex.   The plantar plates are intact.   Muscles and Tendons   The visualized flexor and extensor tendons appear intact. Normal muscle size and signal.   Soft tissues   No joint effusion.   IMPRESSION: The bone density seen bordering the medial base of the second metatarsal on prior radiographs is seen as an intact spur extending dorsally and medially from the dorsal medial aspect of the base of the second metatarsal. The Lisfranc ligament complex appears intact.   There is mild marrow edema within the junction of the base and proximal shaft of second metatarsal possibly stress related change. No acute fracture is seen.     Electronically Signed   By: Neita Garnet M.D.   On: 10/26/2021 18:58   Assessment:   1. Exostosis of left foot      Plan:  Patient was evaluated and treated and all questions answered.  Today we again discussed surgical and nonsurgical treatment.  Now that he has returned to work and is wearing  his work boots he still has pain every day with compression of the exostosis.  He tried alternate lacing and this was not successful.  He is interested in surgical correction of the issue now.  I discussed with him that simple exostectomy likely would be the best route forward.  He may end  up developing arthritis at this joint at some point in the future but I do not see any indication that arthrodesis is necessary at this point.  We discussed the risk benefits and potential complications of the procedure including but not limited to pain, swelling, infection, scar, numbness which may be temporary or permanent, chronic pain, stiffness, nerve pain or damage, wound healing problems.  Despite this he wishes to proceed informed consent was signed and reviewed all questions were addressed.   Surgical plan:  Procedure: -Left foot exostectomy  Location: -[facility]  Anesthesia plan: -IV sedation with local block  Postoperative pain plan: - Tylenol 1000 mg every 6 hours, ibuprofen 600 mg every 6 hours, gabapentin 300 mg every 8 hours x5 days, oxycodone 5 mg 1-2 tabs every 6 hours only as needed  DVT prophylaxis: -None required  WB Restrictions / DME needs: -WBAT in CAM boot which he has currently    No follow-ups on file.

## 2022-02-09 ENCOUNTER — Telehealth: Payer: Self-pay | Admitting: Urology

## 2022-02-09 NOTE — Telephone Encounter (Signed)
DOS - 03/04/22  REMOVAL BONE SPUR LEFT --- 28122  [employer] EFFECTIVE DATE - 01/15/21  PLAN DEDUCTIBLE - $4,000.00 W/ $2,919.16 REMAINING OUT OF POCKET - $6,750.00 W/ $6,060.04 REMAINING COINSURANCE - 30% COPAY - $0.00   SPOKE WITH LYN T. WITH [employer] AND SHE STATED THAT FOR CPT CODE 59977 NO PRIOR AUTH IS REQUIRED.  REF # [id number]

## 2022-03-01 ENCOUNTER — Telehealth: Payer: Self-pay

## 2022-03-01 ENCOUNTER — Encounter: Payer: Self-pay | Admitting: Podiatry

## 2022-03-01 NOTE — Telephone Encounter (Signed)
Brian Kane called to reschedule his surgery with Dr. Lilian Kapur on 03/04/2022. He stated he needs to pay $2000.00 to [facility] and he can't do it at this time. He has been rescheduled to 05/13/2022. Notified Dr. Tamala Bari and Aram Beecham with [facility]

## 2022-03-10 ENCOUNTER — Encounter: Payer: BC Managed Care – PPO | Admitting: Podiatry

## 2022-03-11 NOTE — Telephone Encounter (Signed)
Na

## 2022-03-22 ENCOUNTER — Encounter: Payer: BC Managed Care – PPO | Admitting: Podiatry

## 2022-03-28 ENCOUNTER — Encounter: Payer: Self-pay | Admitting: Podiatry

## 2022-03-28 ENCOUNTER — Telehealth: Payer: Self-pay | Admitting: Podiatry

## 2022-03-28 NOTE — Telephone Encounter (Signed)
Dr. Lilian Kapur, Mr. Brian Kane wanted to return to work on 04/04/2022 with no restrictions. He is going to just cancel surgery for now due to not having long term disability and insurance coverage ending. He will plan to call to r/s in 2024.

## 2022-03-28 NOTE — Telephone Encounter (Signed)
Dr. Lilian Kapur I spoke with Brian Kane today about his up coming surgery on 05/13/2022. The dilema is Mr. Mcgue short term disability ended today and he does not have long term disability with his job. His insurance has to switch to [place] if he remains out of work. So I asked him to make some phone calls and get back in touch with me to see what the plan is. He is thinking he may just have to return to work, but I will keep you and Shelly in the loop.

## 2022-03-31 ENCOUNTER — Encounter: Payer: Self-pay | Admitting: Podiatry

## 2022-04-12 ENCOUNTER — Encounter: Payer: BC Managed Care – PPO | Admitting: Podiatry

## 2022-05-19 ENCOUNTER — Encounter: Payer: BC Managed Care – PPO | Admitting: Podiatry

## 2022-06-02 ENCOUNTER — Encounter: Payer: BC Managed Care – PPO | Admitting: Podiatry

## 2022-06-23 ENCOUNTER — Encounter: Payer: BC Managed Care – PPO | Admitting: Podiatry

## 2022-08-11 ENCOUNTER — Encounter: Payer: BC Managed Care – PPO | Admitting: Family Medicine

## 2022-08-12 ENCOUNTER — Encounter: Payer: Self-pay | Admitting: Family Medicine

## 2022-08-12 ENCOUNTER — Ambulatory Visit (INDEPENDENT_AMBULATORY_CARE_PROVIDER_SITE_OTHER): Payer: Medicaid Other | Admitting: Family Medicine

## 2022-08-12 VITALS — BP 138/90 | HR 79 | Temp 97.0°F | Ht 72.0 in | Wt 288.4 lb

## 2022-08-12 DIAGNOSIS — Z720 Tobacco use: Secondary | ICD-10-CM

## 2022-08-12 DIAGNOSIS — Z114 Encounter for screening for human immunodeficiency virus [HIV]: Secondary | ICD-10-CM | POA: Diagnosis not present

## 2022-08-12 DIAGNOSIS — E049 Nontoxic goiter, unspecified: Secondary | ICD-10-CM | POA: Diagnosis not present

## 2022-08-12 DIAGNOSIS — Z0000 Encounter for general adult medical examination without abnormal findings: Secondary | ICD-10-CM | POA: Diagnosis not present

## 2022-08-12 DIAGNOSIS — Z1159 Encounter for screening for other viral diseases: Secondary | ICD-10-CM | POA: Diagnosis not present

## 2022-08-12 DIAGNOSIS — R030 Elevated blood-pressure reading, without diagnosis of hypertension: Secondary | ICD-10-CM | POA: Diagnosis not present

## 2022-08-12 DIAGNOSIS — Z1389 Encounter for screening for other disorder: Secondary | ICD-10-CM | POA: Insufficient documentation

## 2022-08-12 LAB — CBC
HCT: 50.1 % (ref 39.0–52.0)
Hemoglobin: 17.2 g/dL — ABNORMAL HIGH (ref 13.0–17.0)
MCHC: 34.4 g/dL (ref 30.0–36.0)
MCV: 93.8 fl (ref 78.0–100.0)
Platelets: 266 10*3/uL (ref 150.0–400.0)
RBC: 5.34 Mil/uL (ref 4.22–5.81)
RDW: 14.1 % (ref 11.5–15.5)
WBC: 6.4 10*3/uL (ref 4.0–10.5)

## 2022-08-12 LAB — LIPID PANEL
Cholesterol: 271 mg/dL — ABNORMAL HIGH (ref 0–200)
HDL: 45 mg/dL (ref >39.00)
LDL Cholesterol: 201 mg/dL — ABNORMAL HIGH (ref 0–99)
NonHDL: 225.93
Total CHOL/HDL Ratio: 6
Triglycerides: 125 mg/dL (ref 0.0–149.0)
VLDL: 25 mg/dL (ref 0.0–40.0)

## 2022-08-12 LAB — COMPREHENSIVE METABOLIC PANEL
ALT: 21 U/L (ref 0–53)
AST: 30 U/L (ref 0–37)
Albumin: 4.4 g/dL (ref 3.5–5.2)
Alkaline Phosphatase: 65 U/L (ref 39–117)
BUN: 17 mg/dL (ref 6–23)
CO2: 27 mEq/L (ref 19–32)
Calcium: 9.6 mg/dL (ref 8.4–10.5)
Chloride: 101 mEq/L (ref 96–112)
Creatinine, Ser: 1.03 mg/dL (ref 0.40–1.50)
GFR: 89.72 mL/min (ref >60.00)
Glucose, Bld: 91 mg/dL (ref 70–99)
Potassium: 4.3 mEq/L (ref 3.5–5.1)
Sodium: 139 mEq/L (ref 135–145)
Total Bilirubin: 0.6 mg/dL (ref 0.2–1.2)
Total Protein: 7.7 g/dL (ref 6.0–8.3)

## 2022-08-12 LAB — URINALYSIS, ROUTINE W REFLEX MICROSCOPIC
Bilirubin Urine: NEGATIVE
Ketones, ur: NEGATIVE
Leukocytes,Ua: NEGATIVE
Nitrite: NEGATIVE
Specific Gravity, Urine: 1.025 (ref 1.000–1.030)
Urine Glucose: NEGATIVE
Urobilinogen, UA: 0.2 (ref 0.0–1.0)
pH: 6 (ref 5.0–8.0)

## 2022-08-12 MED ORDER — LEVOTHYROXINE SODIUM 25 MCG PO TABS
25.0000 ug | ORAL_TABLET | Freq: Every day | ORAL | 3 refills | Status: AC
  Filled 2022-11-08: qty 90, 90d supply, fill #0

## 2022-08-12 NOTE — Progress Notes (Signed)
Established Patient Office Visit   Subjective:  Patient ID: Brian Kane, male    DOB: Dec 23, 1980  Age: 42 y.o. MRN: [id number]  Chief Complaint  Patient presents with   Annual Exam    CPE, no concerns. Patient fasting.     HPI Encounter Diagnoses  Name Primary?   Elevated BP without diagnosis of hypertension Yes   Thyroid goiter    Tobacco use    Encounter for hepatitis C screening test for low risk patient    Screening for HIV (human immunodeficiency virus)    Healthcare maintenance    For physical exam.  Healthy and doing well as far as he knows.  He is stressed now.  Has been out of work since October.  He has regular dental care.  No regular exercise program.  Things are okay at home.  Continues to smoke about half a pack a day.  He has decreased his alcohol intake.  Last alcohol consumed was 2 weeks ago.   Review of Systems  Constitutional: Negative.   HENT: Negative.    Eyes:  Negative for blurred vision, discharge and redness.  Respiratory: Negative.    Cardiovascular: Negative.   Gastrointestinal:  Negative for abdominal pain.  Genitourinary: Negative.   Musculoskeletal: Negative.  Negative for myalgias.  Skin:  Negative for rash.  Neurological:  Negative for tingling, loss of consciousness and weakness.  Endo/Heme/Allergies:  Negative for polydipsia.     Current Outpatient Medications:    levothyroxine (SYNTHROID) 25 MCG tablet, Take 1 tablet (25 mcg total) by mouth daily., Disp: 90 tablet, Rfl: 3   Objective:     BP (!) 138/90 (BP Location: Right Arm, Patient Position: Sitting, Cuff Size: Large)   Pulse 79   Temp (!) 97 F (36.1 C) (Temporal)   Ht 6' (1.829 m)   Wt 288 lb 6.4 oz (130.8 kg)   SpO2 99%   BMI 39.11 kg/m  BP Readings from Last 3 Encounters:  08/12/22 (!) 138/90  09/29/21 (!) 150/90  05/20/21 116/74   Wt Readings from Last 3 Encounters:  08/12/22 288 lb 6.4 oz (130.8 kg)  09/29/21 286 lb 6.4 oz (129.9 kg)  05/20/21 283 lb  8 oz (128.6 kg)      Physical Exam Constitutional:      General: He is not in acute distress.    Appearance: Normal appearance. He is not ill-appearing, toxic-appearing or diaphoretic.  HENT:     Head: Normocephalic and atraumatic.     Right Ear: External ear normal.     Left Ear: External ear normal.     Mouth/Throat:     Mouth: Mucous membranes are moist.     Pharynx: Oropharynx is clear. No oropharyngeal exudate or posterior oropharyngeal erythema.  Eyes:     General: No scleral icterus.       Right eye: No discharge.        Left eye: No discharge.     Extraocular Movements: Extraocular movements intact.     Conjunctiva/sclera: Conjunctivae normal.     Pupils: Pupils are equal, round, and reactive to light.  Cardiovascular:     Rate and Rhythm: Normal rate and regular rhythm.  Pulmonary:     Effort: Pulmonary effort is normal. No respiratory distress.     Breath sounds: Normal breath sounds.  Abdominal:     General: Bowel sounds are normal.     Tenderness: There is no abdominal tenderness. There is no guarding or rebound.  Hernia: There is no hernia in the left inguinal area or right inguinal area.  Genitourinary:    Penis: Circumcised. No hypospadias, erythema, tenderness, discharge, swelling or lesions.      Testes:        Right: Mass, tenderness or swelling not present. Right testis is descended.        Left: Mass, tenderness or swelling not present. Left testis is descended.     Epididymis:     Right: Not inflamed or enlarged.     Left: Not inflamed or enlarged.  Musculoskeletal:     Cervical back: No rigidity or tenderness.  Lymphadenopathy:     Lower Body: No right inguinal adenopathy. No left inguinal adenopathy.  Skin:    General: Skin is warm and dry.  Neurological:     Mental Status: He is alert and oriented to person, place, and time.  Psychiatric:        Mood and Affect: Mood normal.        Behavior: Behavior normal.      No results found for  any visits on 08/12/22.    The 10-year ASCVD risk score (Arnett DK, et al., 2019) is: 7.7%* (Cholesterol units were assumed)    Assessment & Plan:   Elevated BP without diagnosis of hypertension -     CBC -     Comprehensive metabolic panel -     Urinalysis, Routine w reflex microscopic  Thyroid goiter -     Levothyroxine Sodium; Take 1 tablet (25 mcg total) by mouth daily.  Dispense: 90 tablet; Refill: 3  Tobacco use  Encounter for hepatitis C screening test for low risk patient -     Hepatitis C antibody  Screening for HIV (human immunodeficiency virus) -     HIV Antibody (routine testing w rflx)  Healthcare maintenance -     Lipid panel    Return in about 3 months (around 11/11/2022), or Please exercise and try to lose some weight.  Please look over the information given today..  Encouraged him to exercise by walking to help with stress and to help him lose weight.  Believe that these things could help to lower his blood pressure.  Encouraged him to stop smoking.  Information was given along quitting tobacco.  Will start low-dose levothyroxine to prevent ongoing enlargement of his goiter.  Information was given on preventing hypertension.  Discussed safe drinking would be more than 2 servings of alcohol in the Setting.  Information was given on health maintenance and disease prevention.  Libby Maw, MD

## 2022-08-13 LAB — HIV ANTIBODY (ROUTINE TESTING W REFLEX): HIV 1&2 Ab, 4th Generation: NONREACTIVE

## 2022-08-13 LAB — HEPATITIS C ANTIBODY: Hepatitis C Ab: NONREACTIVE

## 2022-08-17 DIAGNOSIS — H5213 Myopia, bilateral: Secondary | ICD-10-CM | POA: Diagnosis not present

## 2022-09-25 IMAGING — MR MR FOOT*L* W/O CM
5 series · 40 of 40 positions shown · non-contrast
Comparison: weight-bearing left foot radiographs 10/05/2021,
09/29/2021

CLINICAL DATA: Foot trauma. Lisfranc ligament injury suspected.
X-ray equivocal. Left midfoot pain radiating to toes for 6 weeks.

EXAM:
MRI OF THE LEFT FOOT WITHOUT CONTRAST
TECHNIQUE: Multiplanar, multisequence MR imaging of the left forefoot was
performed. No intravenous contrast was administered.

[Series 4: T1 · coronal · left · 3.0mm · 0.38mm/px · 12 of 52 slices shown (1 of 2)]
[im 1/52]
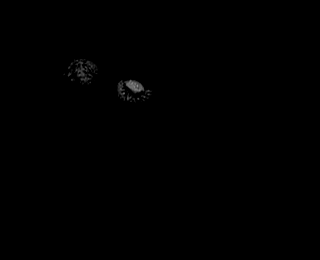
[im 5/52]
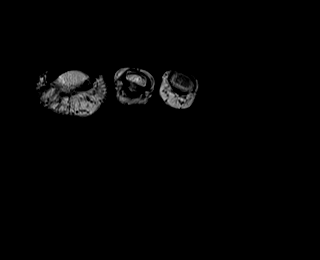
[im 10/52]
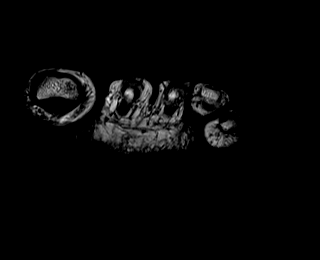
[im 14/52]
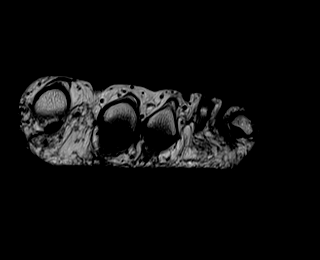
[im 19/52]
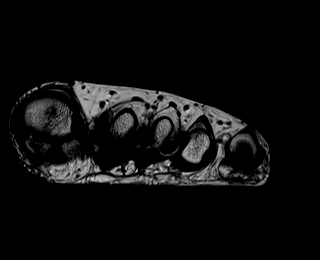
[im 24/52]
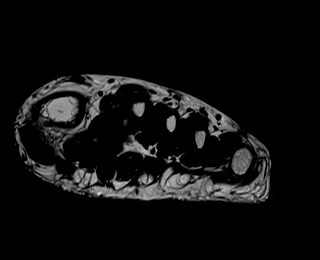
[im 28/52]
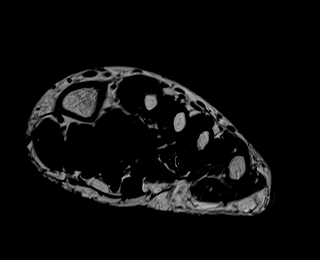
[im 33/52]
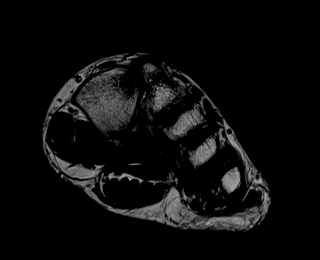
[im 38/52]
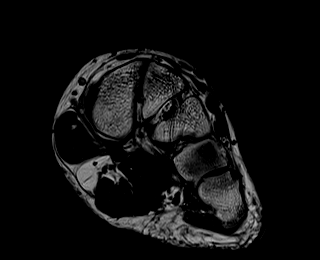
[im 42/52]
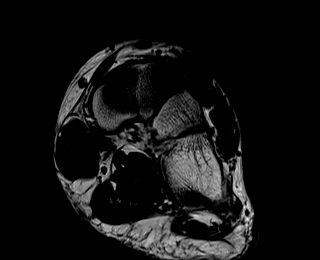
[im 47/52]
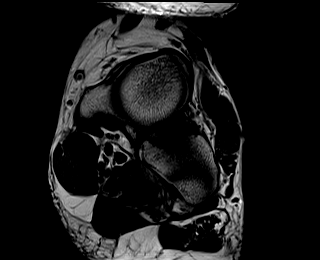
[im 52/52]
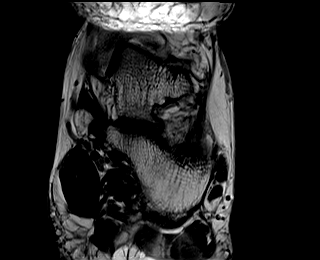

[Series 5: T2 fat-sat · coronal · left · 3.0mm · 0.38mm/px · 11 of 52 slices shown (1 of 2)]
[im 1/52]
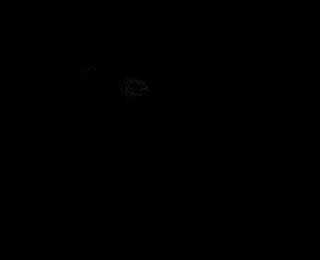
[im 6/52]
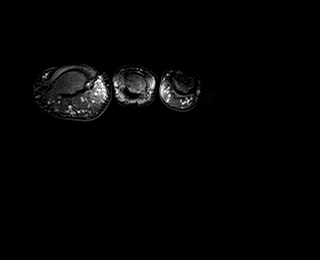
[im 11/52]
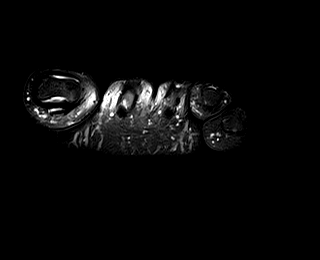
[im 16/52]
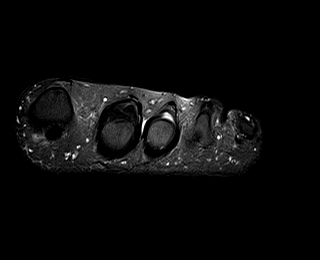
[im 21/52]
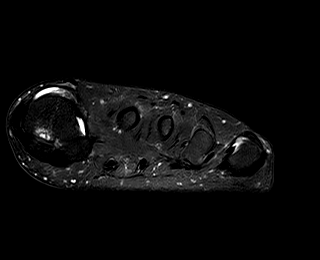
[im 26/52]
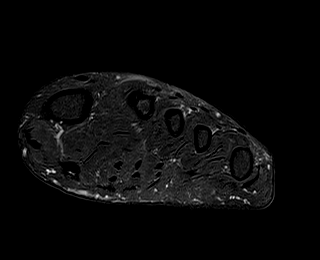
[im 31/52]
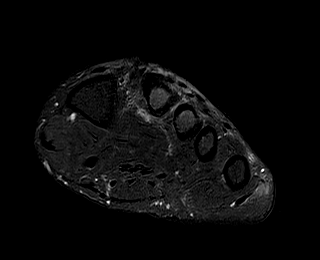
[im 36/52]
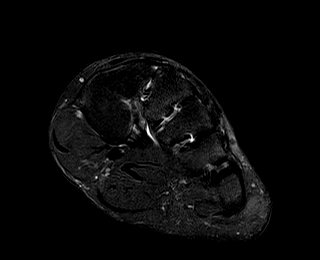
[im 41/52]
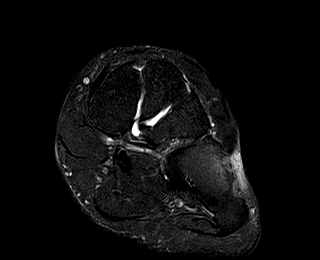
[im 46/52]
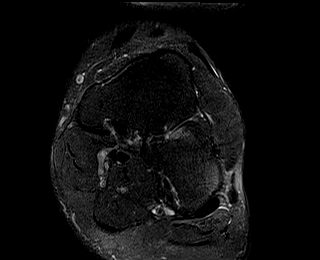
[im 52/52]
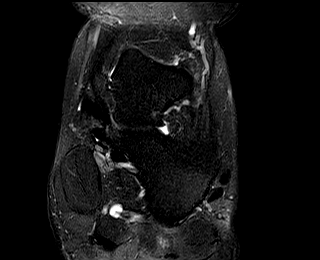

[Series 7: T1 · axial · left · 3.0mm · 0.47mm/px · z∈[-164,-83]mm · 5 of 23 slices shown (2 of 2)]
[im 1/23]
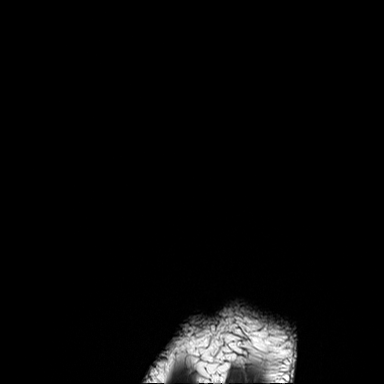
[im 6/23]
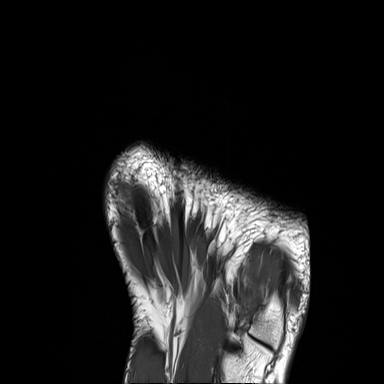
[im 12/23]
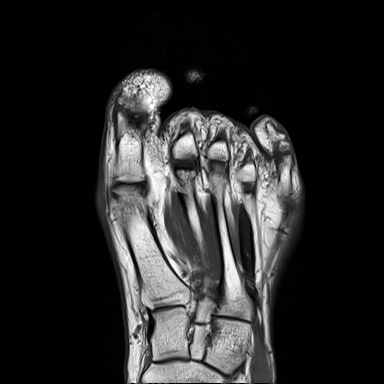
[im 17/23]
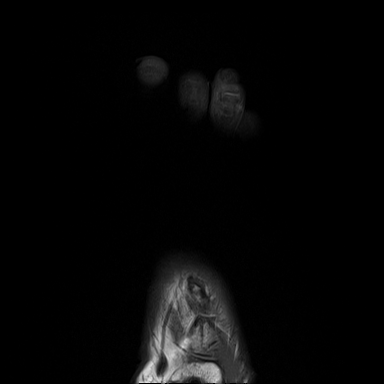
[im 23/23]
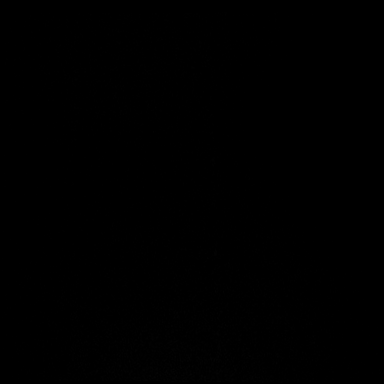

[Series 8: T2 fat-sat · axial · left · 3.0mm · 0.47mm/px · z∈[-164,-83]mm · 5 of 23 slices shown (2 of 2)]
[im 1/23]
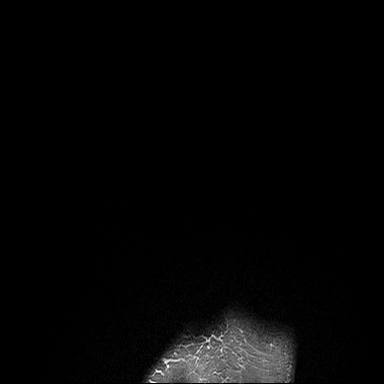
[im 6/23]
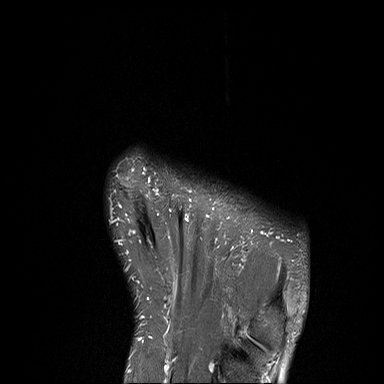
[im 12/23]
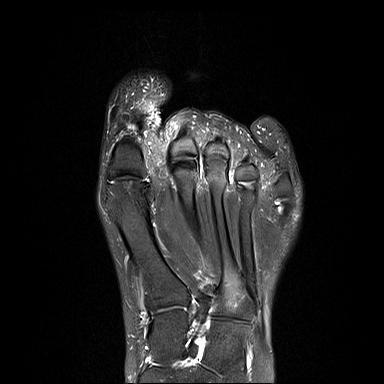
[im 17/23]
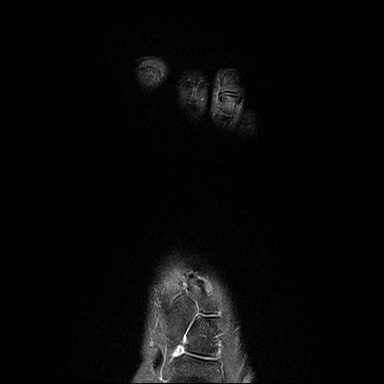
[im 23/23]
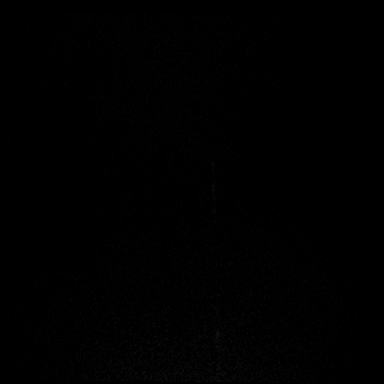

[Series 9: STIR · sagittal · left · 3.0mm · 0.70mm/px · 7 of 32 slices shown]
[im 1/32]
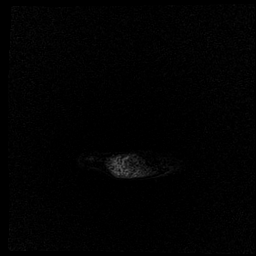
[im 6/32]
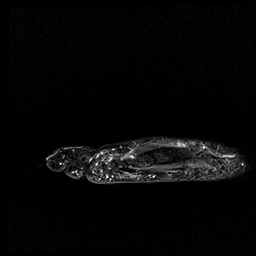
[im 11/32]
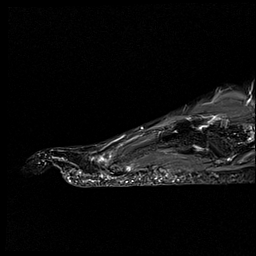
[im 16/32]
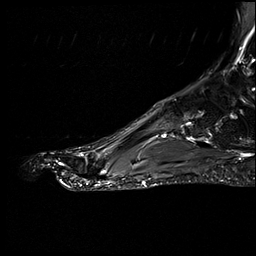
[im 21/32]
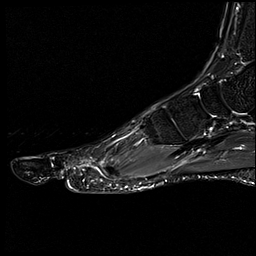
[im 26/32]
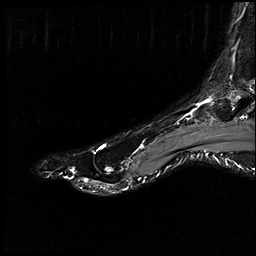
[im 32/32]
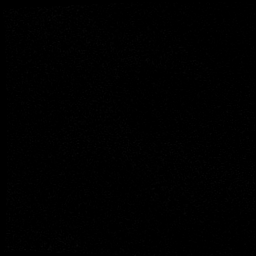

[40 of 40 positions shown; findings below may reference images not displayed]

FINDINGS: Bones/Joint/Cartilage

Note is made on 09/29/2021 radiographs of a small apparent spur
versus avulsion cortical fragment at the medial aspect of the
proximal shaft of second metatarsal, favored to be dorsal on lateral
view. On the current MRI, this is localized as a chronic well
corticated degenerative spur at the dorsal medial aspect of second
metatarsal (coronal series 7, image 8, axial series 4, image 33,
sagittal image 15). There does not appear to be a separated avulsion
cortical fragment. This is at the far dorsal aspect of the Lisfranc
interval and may reflect remote injury to the dorsal component of
the Lisfranc ligament complex, however this ligament appears intact
(coronal series 5, images 32-35) inserts on this spur. The
interosseous and plantar components of the Lisfranc ligament complex
also appear intact.

There is mild marrow edema within the junction of the base and
proximal shaft of second metatarsal (coronal series 8, image 10;
sagittal series 9, image 17).

Moderate great toe metatarsophalangeal joint space narrowing,
cartilage thinning, plantar and distal metatarsal head subchondral
cystic change, and mild peripheral degenerative osteophytosis.

Mild cartilage thinning of the interphalangeal joints diffusely.

Ligaments

Please see above discussion of the Lisfranc ligament complex.

The plantar plates are intact.

Muscles and Tendons

The visualized flexor and extensor tendons appear intact. Normal
muscle size and signal.

Soft tissues

No joint effusion.
IMPRESSION: The bone density seen bordering the medial base of the second
metatarsal on prior radiographs is seen as an intact spur extending
dorsally and medially from the dorsal medial aspect of the base of
the second metatarsal. The Lisfranc ligament complex appears intact.

There is mild marrow edema within the junction of the base and
proximal shaft of second metatarsal possibly stress related change.
No acute fracture is seen.

## 2022-10-26 ENCOUNTER — Ambulatory Visit: Payer: Medicaid Other | Admitting: Family Medicine

## 2022-10-26 ENCOUNTER — Other Ambulatory Visit (HOSPITAL_COMMUNITY): Payer: Self-pay

## 2022-10-26 ENCOUNTER — Encounter: Payer: Self-pay | Admitting: Family Medicine

## 2022-10-26 VITALS — BP 144/96 | HR 91 | Temp 97.9°F | Ht 72.0 in | Wt 293.0 lb

## 2022-10-26 DIAGNOSIS — F1090 Alcohol use, unspecified, uncomplicated: Secondary | ICD-10-CM

## 2022-10-26 DIAGNOSIS — E7800 Pure hypercholesterolemia, unspecified: Secondary | ICD-10-CM | POA: Diagnosis not present

## 2022-10-26 DIAGNOSIS — Z720 Tobacco use: Secondary | ICD-10-CM

## 2022-10-26 DIAGNOSIS — E049 Nontoxic goiter, unspecified: Secondary | ICD-10-CM

## 2022-10-26 DIAGNOSIS — R3915 Urgency of urination: Secondary | ICD-10-CM

## 2022-10-26 DIAGNOSIS — I10 Essential (primary) hypertension: Secondary | ICD-10-CM | POA: Diagnosis not present

## 2022-10-26 LAB — URINALYSIS, ROUTINE W REFLEX MICROSCOPIC
Bilirubin Urine: NEGATIVE
Ketones, ur: NEGATIVE
Leukocytes,Ua: NEGATIVE
Nitrite: NEGATIVE
Specific Gravity, Urine: 1.025 (ref 1.000–1.030)
Urine Glucose: NEGATIVE
Urobilinogen, UA: 1 (ref 0.0–1.0)
pH: 6 (ref 5.0–8.0)

## 2022-10-26 LAB — TSH: TSH: 1.49 u[IU]/mL (ref 0.35–5.50)

## 2022-10-26 LAB — PSA: PSA: 0.5 ng/mL (ref 0.10–4.00)

## 2022-10-26 MED ORDER — AMLODIPINE BESYLATE 5 MG PO TABS
5.0000 mg | ORAL_TABLET | Freq: Every day | ORAL | 1 refills | Status: AC
Start: 2022-10-26 — End: ?
  Filled 2022-10-26: qty 90, 90d supply, fill #0

## 2022-10-26 NOTE — Progress Notes (Signed)
   Established Patient Office Visit   Subjective:  Patient ID: Brian Kane, male    DOB: 1980/09/07  Age: 42 y.o. MRN: [id number]  Chief Complaint  Patient presents with   Nocturia    Concerns about urine frequency can not control urine flow. No accidents.     HPI Encounter Diagnoses  Name Primary?   Thyroid goiter Yes   Tobacco use    Essential hypertension    Urinary urgency    Elevated LDL cholesterol level    Alcohol use disorder    For follow-up of above.  Accompanied by his wife today.  Mains elevated today.  Continues to smoke and is not ready at this time.  He is cut back on his alcohol usage, he still drinks heavily on his days off. he has been having issues with urinary urgency.  He denies dysuria, frequency or discharge.  No family history of prostate disease.  He does drink several sodas a day.  Having no issues taking the levothyroxine low-dose of 25 mcg.   ROS   Current Outpatient Medications:    amLODipine (NORVASC) 5 MG tablet, Take 1 tablet (5 mg total) by mouth daily., Disp: 90 tablet, Rfl: 1   levothyroxine (SYNTHROID) 25 MCG tablet, Take 1 tablet (25 mcg total) by mouth daily., Disp: 90 tablet, Rfl: 3   Objective:     BP (!) 152/88 (BP Location: Left Arm, Patient Position: Sitting, Cuff Size: Large)   Pulse 91   Temp 97.9 F (36.6 C) (Temporal)   Ht 6' (1.829 m)   Wt 293 lb (132.9 kg)   SpO2 97%   BMI 39.74 kg/m  BP Readings from Last 3 Encounters:  10/26/22 (!) 152/88  08/12/22 (!) 138/90  09/29/21 (!) 150/90   Wt Readings from Last 3 Encounters:  10/26/22 293 lb (132.9 kg)  08/12/22 288 lb 6.4 oz (130.8 kg)  09/29/21 286 lb 6.4 oz (129.9 kg)      Physical Exam   No results found for any visits on 10/26/22.    The 10-year ASCVD risk score (Arnett DK, et al., 2019) is: 15%    Assessment & Plan:   Thyroid goiter -     TSH  Tobacco use  Essential hypertension -     amLODIPine Besylate; Take 1 tablet (5 mg total) by  mouth daily.  Dispense: 90 tablet; Refill: 1  Urinary urgency -     PSA -     Urinalysis, Routine w reflex microscopic -     Urine cytology ancillary only  Elevated LDL cholesterol level -     CT CARDIAC SCORING (SELF PAY ONLY); Future  Alcohol use disorder    Return in about 8 weeks (around 12/21/2022).  We did find safe drinking is no more than 2 servings of alcohol on a given day.  Information was given about alcohol misuse and dependence.  Again strongly encouraged tobacco cessation.  He is not ready to quit at this time.  Discussed increasing risk of heart disease and cancers with a combination of hypertension, smoking and alcohol use.  Advised that I would like him to be in a place where he wants to quit and see me back when he was ready.  Information was given on managing hypertension.  Information was given on steps to take out quitting smoking.  Explained that caffeine is a bladder irritant.  Cutting back on caffeine intake could help the urgency.  Mliss Sax, MD

## 2022-10-31 ENCOUNTER — Telehealth (HOSPITAL_BASED_OUTPATIENT_CLINIC_OR_DEPARTMENT_OTHER): Payer: Self-pay

## 2022-11-08 ENCOUNTER — Other Ambulatory Visit (HOSPITAL_COMMUNITY): Payer: Self-pay

## 2022-11-29 ENCOUNTER — Other Ambulatory Visit (HOSPITAL_BASED_OUTPATIENT_CLINIC_OR_DEPARTMENT_OTHER): Payer: Medicaid Other

## 2022-12-21 ENCOUNTER — Telehealth: Payer: Self-pay | Admitting: Family Medicine

## 2022-12-21 ENCOUNTER — Ambulatory Visit: Payer: Medicaid Other | Admitting: Family Medicine

## 2022-12-21 NOTE — Telephone Encounter (Signed)
6.5.24 no show letter sent 

## 2022-12-22 NOTE — Telephone Encounter (Signed)
1st no show since 2022, fee waived, letter sent 

## 2023-01-26 ENCOUNTER — Other Ambulatory Visit (HOSPITAL_COMMUNITY): Payer: Self-pay

## 2023-01-26 DIAGNOSIS — U071 COVID-19: Secondary | ICD-10-CM | POA: Diagnosis not present

## 2023-01-26 DIAGNOSIS — F17210 Nicotine dependence, cigarettes, uncomplicated: Secondary | ICD-10-CM | POA: Diagnosis not present

## 2023-01-26 DIAGNOSIS — R059 Cough, unspecified: Secondary | ICD-10-CM | POA: Diagnosis not present

## 2023-01-26 MED ORDER — BENZONATATE 100 MG PO CAPS
100.0000 mg | ORAL_CAPSULE | Freq: Three times a day (TID) | ORAL | 0 refills | Status: DC
  Filled 2023-01-26: qty 21, 7d supply, fill #0

## 2023-01-26 MED ORDER — PAXLOVID (300/100) 20 X 150 MG & 10 X 100MG PO TBPK
ORAL_TABLET | ORAL | 0 refills | Status: DC
  Filled 2023-01-26: qty 30, 5d supply, fill #0

## 2023-01-26 MED ORDER — IBUPROFEN 800 MG PO TABS
800.0000 mg | ORAL_TABLET | Freq: Three times a day (TID) | ORAL | 0 refills | Status: DC
  Filled 2023-01-26: qty 20, 7d supply, fill #0

## 2023-02-25 DIAGNOSIS — M79672 Pain in left foot: Secondary | ICD-10-CM | POA: Diagnosis not present

## 2023-02-25 DIAGNOSIS — M25775 Osteophyte, left foot: Secondary | ICD-10-CM | POA: Diagnosis not present

## 2023-02-25 DIAGNOSIS — R030 Elevated blood-pressure reading, without diagnosis of hypertension: Secondary | ICD-10-CM | POA: Diagnosis not present

## 2023-02-25 DIAGNOSIS — M2012 Hallux valgus (acquired), left foot: Secondary | ICD-10-CM | POA: Diagnosis not present

## 2023-02-25 DIAGNOSIS — M7732 Calcaneal spur, left foot: Secondary | ICD-10-CM | POA: Diagnosis not present

## 2023-02-25 DIAGNOSIS — F17210 Nicotine dependence, cigarettes, uncomplicated: Secondary | ICD-10-CM | POA: Diagnosis not present

## 2023-02-27 ENCOUNTER — Encounter: Payer: Self-pay | Admitting: Podiatry

## 2023-02-27 ENCOUNTER — Ambulatory Visit: Payer: Medicaid Other | Admitting: Podiatry

## 2023-02-27 ENCOUNTER — Ambulatory Visit: Payer: Medicaid Other

## 2023-02-27 DIAGNOSIS — M779 Enthesopathy, unspecified: Secondary | ICD-10-CM

## 2023-02-27 MED ORDER — TRIAMCINOLONE ACETONIDE 10 MG/ML IJ SUSP
10.0000 mg | Freq: Once | INTRAMUSCULAR | Status: AC
Start: 2023-02-27 — End: 2023-02-27
  Administered 2023-02-27: 10 mg via INTRA_ARTICULAR

## 2023-02-27 MED ORDER — PREDNISONE 10 MG PO TABS: ORAL_TABLET | ORAL | 0 refills | Status: DC

## 2023-03-01 ENCOUNTER — Encounter: Payer: Self-pay | Admitting: Podiatry

## 2023-03-01 NOTE — Progress Notes (Signed)
Subjective:   Patient ID: Brian Kane, male   DOB: 42 y.o.   MRN: [id number]   HPI Patient states he developed a lot of pain on top of his left foot and he does not remember any specific injury.  States it has been about a week hard to walk on and hard to bear weight   ROS      Objective:  Physical Exam  Neurovascular status unchanged with swelling and pain dorsal left foot. No indication of infection or breakdown of tissue. Located closest to 2,3 met distal shaft     Assessment:  Inflammatory condition with fluid buildup around the lesser MPJs left     Plan:  Precautionary x-ray went ahead today and did careful injection around the area of inflammatory tissue and then discussed possibility for systemic issues and I am placing him on a 12-day steroid Dosepak.  If symptoms persist will be seen back  X-rays indicate no signs of fracture or arthritic condition currently

## 2023-03-06 ENCOUNTER — Encounter: Payer: Self-pay | Admitting: Podiatry

## 2023-03-06 ENCOUNTER — Telehealth: Payer: Self-pay | Admitting: Podiatry

## 2023-03-06 NOTE — Telephone Encounter (Signed)
Pt called again about a work note.

## 2023-03-06 NOTE — Telephone Encounter (Signed)
Please fill out for him

## 2023-03-06 NOTE — Telephone Encounter (Signed)
Notified pt letter was done.  He is going to get it from [place].

## 2023-03-06 NOTE — Telephone Encounter (Signed)
Pt scheduled to see you Wednesday for the same problem as last visit and is asking if he could get a note for work until he comes in for his visit. Would it be able to write note for pt to be out of work until 8.22 after he see's you on 8.21.2024

## 2023-03-07 ENCOUNTER — Encounter: Payer: Self-pay | Admitting: Podiatry

## 2023-03-08 ENCOUNTER — Ambulatory Visit (INDEPENDENT_AMBULATORY_CARE_PROVIDER_SITE_OTHER): Payer: Medicaid Other

## 2023-03-08 ENCOUNTER — Encounter: Payer: Self-pay | Admitting: Podiatry

## 2023-03-08 ENCOUNTER — Other Ambulatory Visit (HOSPITAL_COMMUNITY): Payer: Self-pay

## 2023-03-08 ENCOUNTER — Ambulatory Visit: Payer: Medicaid Other | Admitting: Podiatry

## 2023-03-08 DIAGNOSIS — M898X7 Other specified disorders of bone, ankle and foot: Secondary | ICD-10-CM

## 2023-03-08 DIAGNOSIS — M659 Synovitis and tenosynovitis, unspecified: Secondary | ICD-10-CM | POA: Diagnosis not present

## 2023-03-08 DIAGNOSIS — M7752 Other enthesopathy of left foot: Secondary | ICD-10-CM | POA: Diagnosis not present

## 2023-03-08 DIAGNOSIS — S9032XA Contusion of left foot, initial encounter: Secondary | ICD-10-CM

## 2023-03-08 MED ORDER — TRIAMCINOLONE ACETONIDE 10 MG/ML IJ SUSP
10.0000 mg | Freq: Once | INTRAMUSCULAR | Status: AC
Start: 2023-03-08 — End: 2023-03-08
  Administered 2023-03-08: 10 mg via INTRA_ARTICULAR

## 2023-03-08 MED ORDER — DICLOFENAC SODIUM 75 MG PO TBEC
75.0000 mg | DELAYED_RELEASE_TABLET | Freq: Two times a day (BID) | ORAL | 2 refills | Status: DC
Start: 2023-03-08 — End: 2023-03-29
  Filled 2023-03-08: qty 50, 25d supply, fill #0

## 2023-03-08 NOTE — Progress Notes (Signed)
Subjective:   Patient ID: Brian Kane, male   DOB: 42 y.o.   MRN: [id number]   HPI Patient states seem to be improved still having pain but it seems to be slightly different area   ROS      Objective:  Physical Exam  Neurovascular status intact with improvement of dorsal foot provide Robert inflammation of the second MPJ left and also concerned about some inflammation and swelling of the left lateral ankle     Assessment:  2 separate problems with 1 being the possibility for synovitis of the second MPJ left and ankle inflammation pain or possible compensatory like probable     Plan:  H&P reviewed both conditions.  Currently angry reviewed ice stretch activities compression and for the joint I did do anesthetic block of the right forefoot I then aspirated the second MPJ getting a small amount of clear fluid injected quarter cc dexamethasone Kenalog and placed on diclofenac 75 mg twice daily

## 2023-03-09 ENCOUNTER — Ambulatory Visit: Payer: Medicaid Other | Admitting: Family Medicine

## 2023-03-09 ENCOUNTER — Encounter: Payer: Self-pay | Admitting: Family Medicine

## 2023-03-09 VITALS — BP 142/86 | HR 105 | Temp 96.9°F | Ht 72.0 in | Wt 284.2 lb

## 2023-03-09 DIAGNOSIS — Z1389 Encounter for screening for other disorder: Secondary | ICD-10-CM | POA: Diagnosis not present

## 2023-03-09 DIAGNOSIS — Z720 Tobacco use: Secondary | ICD-10-CM

## 2023-03-09 DIAGNOSIS — I10 Essential (primary) hypertension: Secondary | ICD-10-CM

## 2023-03-09 DIAGNOSIS — F1090 Alcohol use, unspecified, uncomplicated: Secondary | ICD-10-CM | POA: Diagnosis not present

## 2023-03-09 DIAGNOSIS — E7800 Pure hypercholesterolemia, unspecified: Secondary | ICD-10-CM

## 2023-03-09 LAB — COMPREHENSIVE METABOLIC PANEL
ALT: 19 U/L (ref 0–53)
AST: 16 U/L (ref 0–37)
Albumin: 4.3 g/dL (ref 3.5–5.2)
Alkaline Phosphatase: 74 U/L (ref 39–117)
BUN: 27 mg/dL — ABNORMAL HIGH (ref 6–23)
CO2: 27 mEq/L (ref 19–32)
Calcium: 9.8 mg/dL (ref 8.4–10.5)
Chloride: 98 mEq/L (ref 96–112)
Creatinine, Ser: 1.16 mg/dL (ref 0.40–1.50)
GFR: 77.48 mL/min (ref >60.00)
Glucose, Bld: 96 mg/dL (ref 70–99)
Potassium: 4.7 mEq/L (ref 3.5–5.1)
Sodium: 136 mEq/L (ref 135–145)
Total Bilirubin: 0.3 mg/dL (ref 0.2–1.2)
Total Protein: 7.6 g/dL (ref 6.0–8.3)

## 2023-03-09 LAB — LDL CHOLESTEROL, DIRECT: Direct LDL: 195 mg/dL

## 2023-03-09 LAB — URIC ACID: Uric Acid, Serum: 10.5 mg/dL — ABNORMAL HIGH (ref 4.0–7.8)

## 2023-03-09 NOTE — Progress Notes (Addendum)
Established Patient Office Visit   Subjective:  Patient ID: Brian Kane, male    DOB: October 02, 1980  Age: 42 y.o. MRN: [id number]  Chief Complaint  Patient presents with   Foot Pain    Left top foot pain x 2 weeks. Sharp pain with edema. Currently taking Prednisone for it. No known injury    Foot Pain Pertinent negatives include no abdominal pain, myalgias, rash or weakness.   Encounter Diagnoses  Name Primary?   Essential hypertension Yes   Tobacco use    Elevated LDL cholesterol level    Screening for gout    Alcohol use disorder    For follow-up of above.  Ongoing recent follow-up with podiatry for pain in his forefoot.  Status post local Kenalog injection and is currently on a prednisone taper.  Compliant with his amlodipine.  Continues to smoke half a pack a day and is not ready to quit.  Continues to drink episodically consuming 4-5 beers when he does.  LDLs cholesterol last measured at 200.  He is concerned about gout.   Review of Systems  Constitutional: Negative.   HENT: Negative.    Eyes:  Negative for blurred vision, discharge and redness.  Respiratory: Negative.    Cardiovascular: Negative.   Gastrointestinal:  Negative for abdominal pain.  Genitourinary: Negative.   Musculoskeletal:  Positive for joint pain. Negative for myalgias.  Skin:  Negative for rash.  Neurological:  Negative for tingling, loss of consciousness and weakness.  Endo/Heme/Allergies:  Negative for polydipsia.     Current Outpatient Medications:    amLODipine (NORVASC) 5 MG tablet, Take 1 tablet (5 mg total) by mouth daily., Disp: 90 tablet, Rfl: 1   ibuprofen (ADVIL) 800 MG tablet, Take 1 tablet (800 mg total) by mouth 3 (three) times daily., Disp: 20 tablet, Rfl: 0   levothyroxine (SYNTHROID) 25 MCG tablet, Take 1 tablet (25 mcg total) by mouth daily., Disp: 90 tablet, Rfl: 3   predniSONE (DELTASONE) 10 MG tablet, 12 day tapering dose, Disp: 48 tablet, Rfl: 0   diclofenac  (VOLTAREN) 75 MG EC tablet, Take 1 tablet (75 mg total) by mouth 2 (two) times daily. (Patient not taking: Reported on 03/09/2023), Disp: 50 tablet, Rfl: 2   Objective:     BP (!) 142/86   Pulse (!) 105   Temp (!) 96.9 F (36.1 C)   Ht 6' (1.829 m)   Wt 284 lb 3.2 oz (128.9 kg)   SpO2 96%   BMI 38.54 kg/m    Physical Exam Constitutional:      General: He is not in acute distress.    Appearance: Normal appearance. He is not ill-appearing, toxic-appearing or diaphoretic.  HENT:     Head: Normocephalic and atraumatic.     Right Ear: External ear normal.     Left Ear: External ear normal.  Eyes:     General: No scleral icterus.       Right eye: No discharge.        Left eye: No discharge.     Extraocular Movements: Extraocular movements intact.     Conjunctiva/sclera: Conjunctivae normal.  Pulmonary:     Effort: Pulmonary effort is normal. No respiratory distress.  Musculoskeletal:     Left foot: Normal capillary refill. No swelling, deformity, tenderness or bony tenderness. Normal pulse.  Skin:    General: Skin is warm and dry.  Neurological:     Mental Status: He is alert and oriented to person, place, and time.  Psychiatric:        Mood and Affect: Mood normal.        Behavior: Behavior normal.      No results found for any visits on 03/09/23.    The 10-year ASCVD risk score (Arnett DK, et al., 2019) is: 13.3%    Assessment & Plan:   Essential hypertension -     Comprehensive metabolic panel  Tobacco use  Elevated LDL cholesterol level -     LDL cholesterol, direct  Screening for gout -     Uric acid  Alcohol use disorder -     Comprehensive metabolic panel    Return in about 6 weeks (around 04/20/2023).  Will check uric acid level to screen for gout.  Explained that his current foot issue is unlikely presentation for gout but possible.  Recheck LDL.  Follow-up in 6 weeks after steroid therapy for recheck of his blood pressure.  Information was  given on managing hypertension.  Information was given on the health risks of smoking and counseled him to stop.  Information given on alcohol misuse and dependence.  Again advised that he consume no more than 2 alcoholic beverages on a given day.  Mliss Sax, MD  8/22 addendum: Uric acid level is elevated.  Information on low purine diet was put into the chart for him to download.

## 2023-03-29 ENCOUNTER — Ambulatory Visit: Payer: Medicaid Other | Admitting: Podiatry

## 2023-03-29 ENCOUNTER — Other Ambulatory Visit (HOSPITAL_COMMUNITY): Payer: Self-pay

## 2023-03-29 ENCOUNTER — Encounter: Payer: Self-pay | Admitting: Podiatry

## 2023-03-29 DIAGNOSIS — M779 Enthesopathy, unspecified: Secondary | ICD-10-CM

## 2023-03-29 DIAGNOSIS — M7752 Other enthesopathy of left foot: Secondary | ICD-10-CM

## 2023-03-29 DIAGNOSIS — M659 Synovitis and tenosynovitis, unspecified: Secondary | ICD-10-CM

## 2023-03-29 MED ORDER — DICLOFENAC SODIUM 75 MG PO TBEC
75.0000 mg | DELAYED_RELEASE_TABLET | Freq: Two times a day (BID) | ORAL | 2 refills | Status: AC
  Filled 2023-03-29 – 2023-03-30 (×2): qty 50, 25d supply, fill #0

## 2023-03-29 NOTE — Progress Notes (Signed)
Subjective:   Patient ID: Brian Kane, male   DOB: 42 y.o.   MRN: [id number]   HPI Patient presents stating I am improved in the areas that you worked on getting some pain in the outside of my left foot and I still have the small bone spur on the top neurovascular   ROS      Objective:  Physical Exam  Status intact with tendinitis dorsal left with small bone spur formation and probable tendinitis lateral left secondary to change in gait from previous treated areas several different     Assessment:  Fullness with possible bone spur at the biggest along with just generalized inflammatory condition     Plan:  H&P reviewed recommended ice shoes with cushion that I dispensed today and the possibility for further treatment for spur removal at 1 point in future with education given today about what would be required and surgical recovery

## 2023-03-30 ENCOUNTER — Other Ambulatory Visit (HOSPITAL_COMMUNITY): Payer: Self-pay

## 2023-04-04 DIAGNOSIS — M25579 Pain in unspecified ankle and joints of unspecified foot: Secondary | ICD-10-CM

## 2023-06-23 ENCOUNTER — Encounter: Payer: Self-pay | Admitting: Family Medicine

## 2023-06-23 ENCOUNTER — Ambulatory Visit: Payer: Medicaid Other | Admitting: Family Medicine

## 2023-06-23 VITALS — BP 122/84 | HR 62 | Temp 98.1°F | Ht 72.0 in | Wt 293.6 lb

## 2023-06-23 DIAGNOSIS — I10 Essential (primary) hypertension: Secondary | ICD-10-CM

## 2023-06-23 DIAGNOSIS — M1A0720 Idiopathic chronic gout, left ankle and foot, without tophus (tophi): Secondary | ICD-10-CM

## 2023-06-23 DIAGNOSIS — E049 Nontoxic goiter, unspecified: Secondary | ICD-10-CM

## 2023-06-23 DIAGNOSIS — E7800 Pure hypercholesterolemia, unspecified: Secondary | ICD-10-CM

## 2023-06-23 LAB — CBC
HCT: 47.7 % (ref 39.0–52.0)
Hemoglobin: 15.9 g/dL (ref 13.0–17.0)
MCHC: 33.4 g/dL (ref 30.0–36.0)
MCV: 93.6 fL (ref 78.0–100.0)
Platelets: 340 10*3/uL (ref 150.0–400.0)
RBC: 5.1 Mil/uL (ref 4.22–5.81)
RDW: 14 % (ref 11.5–15.5)
WBC: 6.7 10*3/uL (ref 4.0–10.5)

## 2023-06-23 LAB — BASIC METABOLIC PANEL
BUN: 19 mg/dL (ref 6–23)
CO2: 27 meq/L (ref 19–32)
Calcium: 9.2 mg/dL (ref 8.4–10.5)
Chloride: 99 meq/L (ref 96–112)
Creatinine, Ser: 0.98 mg/dL (ref 0.40–1.50)
GFR: 94.66 mL/min (ref >60.00)
Glucose, Bld: 123 mg/dL — ABNORMAL HIGH (ref 70–99)
Potassium: 3.6 meq/L (ref 3.5–5.1)
Sodium: 136 meq/L (ref 135–145)

## 2023-06-23 LAB — LDL CHOLESTEROL, DIRECT: Direct LDL: 133 mg/dL

## 2023-06-23 LAB — TSH: TSH: 1.09 u[IU]/mL (ref 0.35–5.50)

## 2023-06-23 LAB — URIC ACID: Uric Acid, Serum: 5.2 mg/dL (ref 4.0–7.8)

## 2023-06-23 MED ORDER — COLCHICINE 0.6 MG PO TABS: 0.6000 mg | ORAL_TABLET | Freq: Every day | ORAL | 0 refills | Status: AC

## 2023-06-23 MED ORDER — ALLOPURINOL 300 MG PO TABS: 300.0000 mg | ORAL_TABLET | Freq: Every day | ORAL | 1 refills | Status: DC

## 2023-06-23 NOTE — Progress Notes (Signed)
Established Patient Office Visit   Subjective:  Patient ID: Brian Kane, male    DOB: 12/31/1980  Age: 42 y.o. MRN: [id number]  Chief Complaint  Patient presents with   Gout    Pt states left foot pain, has been okay until last week. He had a flare. Pt last Uric acid was 7.8 on 06/12/23.    HPI Encounter Diagnoses  Name Primary?   Essential hypertension Yes   Elevated LDL cholesterol level    Chronic gout of left foot, unspecified cause    Thyroid goiter    For follow-up of above.  Recent gouty flare and was seen at urgent care.  They prescribed 6 colchicine.  He took all of his mother's allopurinol and that seemed to help.  He has had 4 gouty flares over the last 8 months.  Uric acid measured at recent urgent care visit was 7.8.  When I checked it several months ago it pends 10.  Flare has not resolved.   Review of Systems  Constitutional: Negative.   HENT: Negative.    Eyes:  Negative for blurred vision, discharge and redness.  Respiratory: Negative.    Cardiovascular: Negative.   Gastrointestinal:  Negative for abdominal pain.  Genitourinary: Negative.   Musculoskeletal: Negative.  Negative for myalgias.  Skin:  Negative for rash.  Neurological:  Negative for tingling, loss of consciousness and weakness.  Endo/Heme/Allergies:  Negative for polydipsia.     Current Outpatient Medications:    allopurinol (ZYLOPRIM) 300 MG tablet, Take 1 tablet (300 mg total) by mouth daily., Disp: 90 tablet, Rfl: 1   amLODipine (NORVASC) 5 MG tablet, Take 1 tablet (5 mg total) by mouth daily., Disp: 90 tablet, Rfl: 1   colchicine 0.6 MG tablet, Take 1 tablet (0.6 mg total) by mouth daily., Disp: 90 tablet, Rfl: 0   diclofenac (VOLTAREN) 75 MG EC tablet, Take 1 tablet (75 mg total) by mouth 2 (two) times daily., Disp: 50 tablet, Rfl: 2   ibuprofen (ADVIL) 800 MG tablet, Take 1 tablet (800 mg total) by mouth 3 (three) times daily., Disp: 20 tablet, Rfl: 0   levothyroxine  (SYNTHROID) 25 MCG tablet, Take 1 tablet (25 mcg total) by mouth daily., Disp: 90 tablet, Rfl: 3   predniSONE (DELTASONE) 10 MG tablet, 12 day tapering dose, Disp: 48 tablet, Rfl: 0   Objective:     BP 122/84   Pulse 62   Temp 98.1 F (36.7 C)   Ht 6' (1.829 m)   Wt 293 lb 9.6 oz (133.2 kg)   SpO2 98%   BMI 39.82 kg/m    Physical Exam Constitutional:      General: He is not in acute distress.    Appearance: Normal appearance. He is not ill-appearing, toxic-appearing or diaphoretic.  HENT:     Head: Normocephalic and atraumatic.     Right Ear: External ear normal.     Left Ear: External ear normal.  Eyes:     General: No scleral icterus.       Right eye: No discharge.        Left eye: No discharge.     Extraocular Movements: Extraocular movements intact.     Conjunctiva/sclera: Conjunctivae normal.  Cardiovascular:     Pulses:          Dorsalis pedis pulses are 2+ on the left side.       Posterior tibial pulses are 2+ on the left side.  Pulmonary:     Effort: Pulmonary  effort is normal. No respiratory distress.  Musculoskeletal:     Left foot: Normal range of motion. No swelling or tenderness. Normal pulse.  Skin:    General: Skin is warm and dry.  Neurological:     Mental Status: He is alert and oriented to person, place, and time.  Psychiatric:        Mood and Affect: Mood normal.        Behavior: Behavior normal.   Here comes Mr. Gardy   No results found for any visits on 06/23/23.    The 10-year ASCVD risk score (Arnett DK, et al., 2019) is: 10.7%    Assessment & Plan:   Essential hypertension -     Basic metabolic panel -     CBC  Elevated LDL cholesterol level -     LDL cholesterol, direct  Chronic gout of left foot, unspecified cause -     Uric acid -     Allopurinol; Take 1 tablet (300 mg total) by mouth daily.  Dispense: 90 tablet; Refill: 1 -     Colchicine; Take 1 tablet (0.6 mg total) by mouth daily.  Dispense: 90 tablet; Refill:  0  Thyroid goiter -     TSH    Return in about 3 months (around 09/21/2023).  Will start colchicine 0.6 daily with allopurinol 300 mg daily to lower his uric acid.  Information was given on gout, allopurinol as well as colchicine.  He was also given information on a low purine diet.  Information was also given on preventing high cholesterol.  We discussed safe alcohol usage is no more than 2 servings of alcohol daily.  Mliss Sax, MD

## 2023-07-06 ENCOUNTER — Encounter: Payer: Self-pay | Admitting: Internal Medicine

## 2023-07-06 ENCOUNTER — Ambulatory Visit (INDEPENDENT_AMBULATORY_CARE_PROVIDER_SITE_OTHER): Payer: Self-pay | Admitting: Internal Medicine

## 2023-07-06 VITALS — BP 124/84 | HR 82 | Temp 98.0°F | Ht 72.0 in | Wt 295.2 lb

## 2023-07-06 DIAGNOSIS — M79672 Pain in left foot: Secondary | ICD-10-CM

## 2023-07-06 DIAGNOSIS — M779 Enthesopathy, unspecified: Secondary | ICD-10-CM

## 2023-07-06 MED ORDER — INDOMETHACIN 50 MG PO CAPS: 50.0000 mg | ORAL_CAPSULE | Freq: Three times a day (TID) | ORAL | 0 refills | Status: DC
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
## 2023-07-06 NOTE — Patient Instructions (Signed)
DO NOT TAKE IBUPROFEN OR DICLOFENAC (VOLTAREN) WHILE TAKING THE INDOMETHACIN.

## 2023-07-07 ENCOUNTER — Encounter: Payer: Self-pay | Admitting: Family Medicine

## 2023-07-07 ENCOUNTER — Ambulatory Visit: Payer: Self-pay | Admitting: Family Medicine

## 2023-07-07 LAB — URIC ACID: Uric Acid, Serum: 5 mg/dL (ref 4.0–7.8)

## 2023-07-11 NOTE — Telephone Encounter (Signed)
Pt called to speak with the nurse saying he was following up on the message he sent last Friday. Told him to go check his MyChart for response from PCP. He insists on a call back.

## 2023-07-12 ENCOUNTER — Telehealth: Payer: Self-pay

## 2023-07-12 DIAGNOSIS — M79672 Pain in left foot: Secondary | ICD-10-CM
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
## 2023-07-12 NOTE — Patient Instructions (Signed)
Brian Kane, thank you for joining Reed Pandy, PA-C for today's virtual visit.  While this provider is not your primary care provider (PCP), if your PCP is located in our provider database this encounter information will be shared with them immediately following your visit.   A [facility] MyChart account gives you access to today's visit and all your visits, tests, and labs performed at [facility] " click here if you don't have a [facility] MyChart account or go to [email or web address]  Consent: (Patient) Brian Kane provided verbal consent for this virtual visit at the beginning of the encounter.  Current Medications:  Current Outpatient Medications:    allopurinol (ZYLOPRIM) 300 MG tablet, Take 1 tablet (300 mg total) by mouth daily., Disp: 90 tablet, Rfl: 1   amLODipine (NORVASC) 5 MG tablet, Take 1 tablet (5 mg total) by mouth daily., Disp: 90 tablet, Rfl: 1   colchicine 0.6 MG tablet, Take 1 tablet (0.6 mg total) by mouth daily., Disp: 90 tablet, Rfl: 0   diclofenac (VOLTAREN) 75 MG EC tablet, Take 1 tablet (75 mg total) by mouth 2 (two) times daily., Disp: 50 tablet, Rfl: 2   levothyroxine (SYNTHROID) 25 MCG tablet, Take 1 tablet (25 mcg total) by mouth daily., Disp: 90 tablet, Rfl: 3   predniSONE (DELTASONE) 10 MG tablet, 12 day tapering dose (Patient not taking: Reported on 07/06/2023), Disp: 48 tablet, Rfl: 0   Medications ordered in this encounter:  No orders of the defined types were placed in this encounter.    *If you need refills on other medications prior to your next appointment, please contact your pharmacy*  Follow-Up: Call back or seek an in-person evaluation if the symptoms worsen or if the condition fails to improve as anticipated.  [facility] [phone]  Other Instructions Foot Pain Many things can cause foot pain. Common causes include injuries to the foot. The injuries include sprains or broken bones,  or injuries that affect the nerves in the feet. Other causes of foot pain include arthritis, blisters, and bunions. To know what causes your foot pain, your health care provider will take a detailed history of your symptoms. They will also do a physical exam as well as imaging tests, such as X-ray or MRI. Follow these instructions at home: Managing pain, stiffness, and swelling  If told, put ice on the painful area. Put ice in a plastic bag. Place a towel between your skin and the bag. Leave the ice on for 20 minutes, 2-3 times a day. If your skin turns bright red, remove the ice right away to prevent skin damage. The risk of damage is higher if you cannot feel pain, heat, or cold. Activity Do not stand or walk for long periods. Do stretches to relieve foot pain and stiffness as told by your provider. Do not lift anything that is heavier than 10 lb (4.5 kg), or the limit that you are told, until your provider says that it is safe. Lifting a lot of weight can put added pressure on your feet. Return to your normal activities as told by your provider. Ask your provider what activities are safe for you. Lifestyle Wear comfortable, supportive shoes that fit you well. Do not wear high heels. Keep your feet clean and dry. General instructions Take over-the-counter and prescription medicines only as told by your provider. Rub your foot gently. Pay attention to any changes in your symptoms. Let your provider know if symptoms become worse. Keep all  follow-up visits. Your provider will want to monitor your progress. Contact a health care provider if: Your pain does not get better after a few days of treatment at home. Your pain gets worse. You cannot stand on your foot. Your foot or toes are swollen. Your foot is numb or tingling. Get help right away if: Your foot or toes turn white or blue. You have warmth and redness along your foot. This information is not intended to replace advice given to  you by your health care provider. Make sure you discuss any questions you have with your health care provider. Document Revised: 07/28/2022 Document Reviewed: 04/05/2022 Elsevier Patient Education  2024 Elsevier Inc.    If you have been instructed to have an in-person evaluation today at a local Urgent Care facility, please use the link below. It will take you to a list of all of our available [facility], including address, phone number and hours of operation. Please do not delay care.  [facility]  If you or a family member do not have a primary care provider, use the link below to schedule a visit and establish care. When you choose a [facility] primary care physician or advanced practice provider, you gain a long-term partner in health. Find a Primary Care Provider  Learn more about [facility]'s in-office and virtual care options: [facility] - Get Care Now

## 2023-07-16 ENCOUNTER — Telehealth: Payer: Self-pay | Admitting: Family

## 2023-07-16 DIAGNOSIS — Z8739 Personal history of other diseases of the musculoskeletal system and connective tissue: Secondary | ICD-10-CM

## 2023-07-16 DIAGNOSIS — M79672 Pain in left foot: Secondary | ICD-10-CM

## 2023-07-16 DIAGNOSIS — M779 Enthesopathy, unspecified: Secondary | ICD-10-CM
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
## 2023-07-18 ENCOUNTER — Encounter: Payer: Self-pay | Admitting: Family Medicine

## 2023-07-18 ENCOUNTER — Ambulatory Visit (INDEPENDENT_AMBULATORY_CARE_PROVIDER_SITE_OTHER): Payer: Self-pay | Admitting: Family Medicine

## 2023-07-18 VITALS — BP 144/98 | HR 96 | Temp 98.0°F | Ht 72.0 in | Wt 285.6 lb

## 2023-07-18 DIAGNOSIS — Z8739 Personal history of other diseases of the musculoskeletal system and connective tissue: Secondary | ICD-10-CM | POA: Insufficient documentation

## 2023-07-18 DIAGNOSIS — M779 Enthesopathy, unspecified: Secondary | ICD-10-CM | POA: Insufficient documentation

## 2023-07-18 DIAGNOSIS — I10 Essential (primary) hypertension: Secondary | ICD-10-CM

## 2023-07-18 DIAGNOSIS — M79672 Pain in left foot: Secondary | ICD-10-CM

## 2023-07-18 NOTE — Progress Notes (Signed)
 Established Patient Office Visit   Subjective:  Patient ID: Brian Kane, male    DOB: January 08, 1981  Age: 42 y.o. MRN: [id number]  Chief Complaint  Patient presents with   foot pain    Foot pain is constant and not resolved.     HPI Encounter Diagnoses  Name Primary?   Left foot pain Yes   Bone spur    H/O: gout    Essential hypertension    Ongoing history of left foot pain over at least the last 9 months.  Uric acid has been elevated to 10.5 with 1 presentation.  It was thought that there may be a gouty component to the pain and he is since been taking allopurinol  300 mg daily.  Last uric acid was measured at 7.8.  Left foot pain persists.  Status post podiatry workup that included an MRI of the left foot.  There was a small spur noted at the base of the dorsal second metatarsal.  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  He believes that his foot has worsened after he had been required to wear steel toed boots.  He has changed boots 3 times but they still seem to bother him.  He did not take his blood pressure medicine over the last few days.   Review of Systems  Constitutional: Negative.   HENT: Negative.    Eyes:  Negative for blurred vision, discharge and redness.  Respiratory: Negative.    Cardiovascular: Negative.   Gastrointestinal:  Negative for abdominal pain.  Genitourinary: Negative.   Musculoskeletal:  Positive for joint pain. Negative for myalgias.  Skin:  Negative for rash.  Neurological:  Negative for tingling, loss of consciousness and weakness.  Endo/Heme/Allergies:  Negative for polydipsia.     Current Outpatient Medications:    allopurinol  (ZYLOPRIM ) 300 MG tablet, Take 1 tablet (300 mg total) by mouth daily., Disp: 90 tablet, Rfl: 1   amLODipine  (NORVASC ) 5 MG tablet, Take 1 tablet (5 mg total) by mouth daily., Disp: 90 tablet, Rfl: 1   colchicine  0.6 MG tablet, Take 1 tablet (0.6 mg total) by mouth daily., Disp: 90 tablet,  Rfl: 0   diclofenac  (VOLTAREN ) 75 MG EC tablet, Take 1 tablet (75 mg total) by mouth 2 (two) times daily., Disp: 50 tablet, Rfl: 2   levothyroxine  (SYNTHROID ) 25 MCG tablet, Take 1 tablet (25 mcg total) by mouth daily., Disp: 90 tablet, Rfl: 3   Objective:     BP (!) 144/98   Pulse 96   Temp 98 F (36.7 C)   Ht 6' (1.829 m)   Wt 285 lb 9.6 oz (129.5 kg)   SpO2 99%   BMI 38.73 kg/m    Physical Exam Constitutional:      General: He is not in acute distress.    Appearance: Normal appearance. He is not ill-appearing, toxic-appearing or diaphoretic.  HENT:     Head: Normocephalic and atraumatic.     Right Ear: External ear normal.     Left Ear: External ear normal.  Eyes:     General: No scleral icterus.       Right eye: No discharge.        Left eye: No discharge.     Extraocular Movements: Extraocular movements intact.     Conjunctiva/sclera: Conjunctivae normal.  Pulmonary:     Effort: Pulmonary effort is normal. No respiratory distress.  Musculoskeletal:     Left ankle: No swelling. Tenderness present.  Left foot: Bony tenderness present. No swelling or deformity. Normal pulse.     Comments: Foot is standard.  There is tenderness to palpation along the dorsum at the base of the second metatarsal.  There was no tenderness to ventral palpation.  There is mild tenderness to palpation of the joint space of the medial and lateral malleoli.  Skin:    General: Skin is warm and dry.  Neurological:     Mental Status: He is alert and oriented to person, place, and time.  Psychiatric:        Mood and Affect: Mood normal.        Behavior: Behavior normal.      No results found for any visits on 07/18/23.    The 10-year ASCVD risk score (Arnett DK, et al., 2019) is: 14.4%    Assessment & Plan:   Left foot pain -     Ambulatory referral to Orthopedic Surgery  Bone spur -     Ambulatory referral to Orthopedic Surgery  H/O: gout  Essential  hypertension    Return Bring your steel toed boots with you when you see Dr. Kit..  Please take blood pressure medicines daily.  Elsie Sim Lent, MD

## 2023-07-24 ENCOUNTER — Encounter: Payer: Self-pay | Admitting: Family Medicine

## 2023-07-26 ENCOUNTER — Telehealth: Payer: Self-pay | Admitting: Nurse Practitioner

## 2023-07-26 DIAGNOSIS — M79672 Pain in left foot: Secondary | ICD-10-CM

## 2023-07-26 DIAGNOSIS — G8929 Other chronic pain: Secondary | ICD-10-CM
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
## 2023-07-27 ENCOUNTER — Telehealth: Payer: Self-pay

## 2023-07-27 NOTE — Telephone Encounter (Signed)
 Copied from CRM [id number]. Topic: General - Other >> Jul 27, 2023  8:50 AM Pinkey ORN wrote: Reason for CRM: Patient Is Requesting A Call Back >> Jul 27, 2023  8:52 AM Pinkey ORN wrote: Follow Up In Regards To Seeing Lauraine Kitty, Patient Has A Few Questions And Concerns. Call Back Number [phone]

## 2023-07-27 NOTE — Telephone Encounter (Signed)
 Spoke to patinet and he is unable to work due to the foot pain.  He would like to have a note for work to allow him to be out till he seed the foot specialist on 08/01/23.   Please review and advise.  Thanks. Dm/cma

## 2023-07-27 NOTE — Telephone Encounter (Signed)
 Letter written to be out of work from 07/26/23 -08/02/23.  Patient notified VIA phone.[name] Dm/cma

## 2023-07-28 NOTE — Telephone Encounter (Signed)
 Copied from CRM [id number]. Topic: MyChart - Other >> Jul 28, 2023  9:12 AM Joanell NOVAK wrote: Reason for CRM: Pt stated that he is needing his doctors note sent to his Mychart, he stated that he is not able to see it   KO 07/28/2023 9:40a talked w/patient. Resent letter in [place]. He was able to login and pull up the letter.   Pt asked if Dr. Berneta can fill out FMLA papers. Pt states they have not been sent to the office yet. Advised him to have it faxed to [phone] for provider review.

## 2023-07-28 NOTE — Telephone Encounter (Signed)
 Patient notified VIA that we can fill out the [facility] form.  He will either fax or bring the form to [place]. Advised him that it takes 5-7 business days to process. Dm/cma

## 2023-07-28 NOTE — Telephone Encounter (Signed)
 Lft VM to rtn call. Dm/cma

## 2023-08-01 ENCOUNTER — Ambulatory Visit (INDEPENDENT_AMBULATORY_CARE_PROVIDER_SITE_OTHER): Payer: Medicaid Other | Admitting: Podiatry

## 2023-08-01 ENCOUNTER — Encounter: Payer: Self-pay | Admitting: Podiatry

## 2023-08-01 DIAGNOSIS — M898X7 Other specified disorders of bone, ankle and foot: Secondary | ICD-10-CM | POA: Diagnosis not present

## 2023-08-01 NOTE — Progress Notes (Signed)
 Subjective:  Patient ID: Brian Kane, male    DOB: 04-16-1981,  MRN: [id number]  Chief Complaint  Patient presents with   Foot Pain    He reports that he had more pain since the last visit, sometimes medication and heat or ice will help.    43 y.o. male presents with the above complaint. History confirmed with patient.  He returns for follow-up its still painful and continues to worsen especially if he is working in his boots  Objective:  Physical Exam: warm, good capillary refill, no trophic changes or ulcerative lesions, normal DP and PT pulses, normal sensory exam, and no pain or instability over the midfoot there is a palpable dorsal spurring of the second metatarsal base  Radiographs: Multiple views x-ray of left foot taken today show a possible fracture or osteophyte at the base of the second metatarsal and the Lisfranc interval with slight displacement of the second metatarsal  Study Result  Narrative & Impression  CLINICAL DATA:  Foot trauma. Lisfranc ligament injury suspected. X-ray equivocal. Left midfoot pain radiating to toes for 6 weeks.   EXAM: MRI OF THE LEFT FOOT WITHOUT CONTRAST   TECHNIQUE: Multiplanar, multisequence MR imaging of the left forefoot was performed. No intravenous contrast was administered.   COMPARISON:  weight-bearing left foot radiographs 10/05/2021, 09/29/2021   FINDINGS: Bones/Joint/Cartilage   Note is made on 09/29/2021 radiographs of a small apparent spur versus avulsion cortical fragment at the medial aspect of the proximal shaft of second metatarsal, favored to be dorsal on lateral view. On the current MRI, this is localized as a chronic well corticated degenerative spur at the dorsal medial aspect of second metatarsal (coronal series 7, image 8, axial series 4, image 33, sagittal image 15). There does not appear to be a separated avulsion cortical fragment. This is at the far dorsal aspect of the Lisfranc interval and  may reflect remote injury to the dorsal component of the Lisfranc ligament complex, however this ligament appears intact (coronal series 5, images 32-35) inserts on this spur. The interosseous and plantar components of the Lisfranc ligament complex also appear intact.   There is mild marrow edema within the junction of the base and proximal shaft of second metatarsal (coronal series 8, image 10; sagittal series 9, image 17).   Moderate great toe metatarsophalangeal joint space narrowing, cartilage thinning, plantar and distal metatarsal head subchondral cystic change, and mild peripheral degenerative osteophytosis.   Mild cartilage thinning of the interphalangeal joints diffusely.   Ligaments   Please see above discussion of the Lisfranc ligament complex.   The plantar plates are intact.   Muscles and Tendons   The visualized flexor and extensor tendons appear intact. Normal muscle size and signal.   Soft tissues   No joint effusion.   IMPRESSION: The bone density seen bordering the medial base of the second metatarsal on prior radiographs is seen as an intact spur extending dorsally and medially from the dorsal medial aspect of the base of the second metatarsal. The Lisfranc ligament complex appears intact.   There is mild marrow edema within the junction of the base and proximal shaft of second metatarsal possibly stress related change. No acute fracture is seen.     Electronically Signed   By: Tanda Lyons M.D.   On: 10/26/2021 18:58   Assessment:   1. Exostosis of left foot       Plan:  Patient was evaluated and treated and all questions answered.  Today  we again discussed surgical and nonsurgical treatment.   He is again interested in surgical correction of the issue now.  We discussed the risk benefits and potential complications of the procedure including but not limited to pain, swelling, infection, scar, numbness which may be temporary or permanent,  chronic pain, stiffness, nerve pain or damage, wound healing problems.  He wishes to proceed and a new informed consent was signed and reviewed all questions were addressed.   Surgical plan:  Procedure: -Left foot exostectomy  Location: -[facility]  Anesthesia plan: -IV sedation with local block  Postoperative pain plan: - Tylenol  1000 mg every 6 hours, ibuprofen  600 mg every 6 hours, gabapentin  300 mg every 8 hours x5 days, oxycodone  5 mg 1-2 tabs every 6 hours only as needed  DVT prophylaxis: -None required  WB Restrictions / DME needs: -WBAT in CAM boot which he has currently    No follow-ups on file.

## 2023-08-10 ENCOUNTER — Encounter: Payer: Self-pay | Admitting: Family Medicine

## 2023-08-11 NOTE — Telephone Encounter (Signed)
Copied from CRM [id number]. Topic: Medical Record Request - Other >> Aug 11, 2023 12:27 PM Leavy Cella D wrote: Reason for CRM: Patient called to ask if his forms that was filled out by Dr.Kremer could be scanned into his MyChart so that he could access them . Please call patient back to advise

## 2023-08-14 ENCOUNTER — Ambulatory Visit: Payer: Medicaid Other | Admitting: Podiatry

## 2023-08-14 NOTE — Telephone Encounter (Signed)
Copied from CRM [id number]. Topic: Medical Record Request - Records Request >> Aug 14, 2023  9:29 AM Dennison Nancy wrote: Reason for CRM: patient request  want nurse to upload his FMLA paperwork on mychart so he can print it off , and once the paperwork is upload mychart please call patient back so he can print it off callback #  [phone]

## 2023-08-22 ENCOUNTER — Encounter: Payer: Self-pay | Admitting: Podiatry

## 2023-08-22 ENCOUNTER — Telehealth: Payer: Self-pay | Admitting: Podiatry

## 2023-08-22 NOTE — Telephone Encounter (Signed)
 Completed STD paperwork from [facility] for the patient .... Faxed to [phone].   Also updated the RTW date after s/w Dr. Silva -- he approved 11/19/2023 ....   Uploaded a letter to MyChart stating same ....   Called patient to advise of paperwork and letter .SABRA...     J. Abbott -- 08/22/2023

## 2023-08-25 ENCOUNTER — Telehealth: Payer: Self-pay | Admitting: Podiatry

## 2023-08-25 ENCOUNTER — Encounter: Payer: Self-pay | Admitting: Podiatry

## 2023-08-25 NOTE — Telephone Encounter (Signed)
 Pt called and would like to move his 10/20/23 sx date up sooner, is hoping to get a sx date for next month.

## 2023-08-29 ENCOUNTER — Telehealth: Payer: Self-pay | Admitting: Podiatry

## 2023-08-29 NOTE — Telephone Encounter (Signed)
Patient called and said [facility] requested office notes from January showing his Cam Boot was necessary ....  Notes state Dr. Lilian Kapur required [facility] and patient was using one he already had.  Sent copy of 08/01/23 office notes along with Dr's letter dated 08/22/2023 -- faxed to [phone].  Brian Kane acknowledged that we are trying to move his DOS from 10/20/2023 (see prev note) .[name]...    J. Abbott -- 08/29/2023

## 2023-09-08 ENCOUNTER — Telehealth: Payer: Self-pay | Admitting: Podiatry

## 2023-09-08 NOTE — Telephone Encounter (Signed)
 Called pt so that I can get his 2025 insurance information, in order to start his surgery auth. Pt's voicemail was full so I could not leave a message, will try again.

## 2023-09-12 ENCOUNTER — Telehealth: Payer: Self-pay | Admitting: Podiatry

## 2023-09-12 NOTE — Telephone Encounter (Signed)
 Called pt to get updated insurance information, in order to get auth started for his 10/20/23 surgery. He stated that he will email over a copy of his new card.

## 2023-09-19 ENCOUNTER — Ambulatory Visit (INDEPENDENT_AMBULATORY_CARE_PROVIDER_SITE_OTHER)

## 2023-09-19 ENCOUNTER — Encounter: Payer: Self-pay | Admitting: Podiatry

## 2023-09-19 ENCOUNTER — Ambulatory Visit (INDEPENDENT_AMBULATORY_CARE_PROVIDER_SITE_OTHER): Payer: Medicaid Other | Admitting: Podiatry

## 2023-09-19 DIAGNOSIS — M898X7 Other specified disorders of bone, ankle and foot: Secondary | ICD-10-CM | POA: Diagnosis not present

## 2023-09-19 NOTE — Progress Notes (Signed)
 Subjective:  Patient ID: Brian Kane, male    DOB: Apr 23, 1981,  MRN: [id number]  Chief Complaint  Patient presents with   Follow-up    Patient states that he still has some pain in his left foot and he is still not able to do certain activities.  Patient has been taking medication for pain     43 y.o. male presents with the above complaint. History confirmed with patient.  He returns for follow-up symptoms are relatively unchanged still painful he is only able to wear bedroom shoe for the most part  Objective:  Physical Exam: warm, good capillary refill, no trophic changes or ulcerative lesions, normal DP and PT pulses, normal sensory exam, and no pain or instability over the midfoot there is a palpable dorsal spurring of the second metatarsal base  Radiographs: Multiple views x-ray of left foot taken today show unchanged alignment, osteophyte dorsal second TMT  Study Result  Narrative & Impression  CLINICAL DATA:  Foot trauma. Lisfranc ligament injury suspected. X-ray equivocal. Left midfoot pain radiating to toes for 6 weeks.   EXAM: MRI OF THE LEFT FOOT WITHOUT CONTRAST   TECHNIQUE: Multiplanar, multisequence MR imaging of the left forefoot was performed. No intravenous contrast was administered.   COMPARISON:  weight-bearing left foot radiographs 10/05/2021, 09/29/2021   FINDINGS: Bones/Joint/Cartilage   Note is made on 09/29/2021 radiographs of a small apparent spur versus avulsion cortical fragment at the medial aspect of the proximal shaft of second metatarsal, favored to be dorsal on lateral view. On the current MRI, this is localized as a chronic well corticated degenerative spur at the dorsal medial aspect of second metatarsal (coronal series 7, image 8, axial series 4, image 33, sagittal image 15). There does not appear to be a separated avulsion cortical fragment. This is at the far dorsal aspect of the Lisfranc interval and may reflect remote  injury to the dorsal component of the Lisfranc ligament complex, however this ligament appears intact (coronal series 5, images 32-35) inserts on this spur. The interosseous and plantar components of the Lisfranc ligament complex also appear intact.   There is mild marrow edema within the junction of the base and proximal shaft of second metatarsal (coronal series 8, image 10; sagittal series 9, image 17).   Moderate great toe metatarsophalangeal joint space narrowing, cartilage thinning, plantar and distal metatarsal head subchondral cystic change, and mild peripheral degenerative osteophytosis.   Mild cartilage thinning of the interphalangeal joints diffusely.   Ligaments   Please see above discussion of the Lisfranc ligament complex.   The plantar plates are intact.   Muscles and Tendons   The visualized flexor and extensor tendons appear intact. Normal muscle size and signal.   Soft tissues   No joint effusion.   IMPRESSION: The bone density seen bordering the medial base of the second metatarsal on prior radiographs is seen as an intact spur extending dorsally and medially from the dorsal medial aspect of the base of the second metatarsal. The Lisfranc ligament complex appears intact.   There is mild marrow edema within the junction of the base and proximal shaft of second metatarsal possibly stress related change. No acute fracture is seen.     Electronically Signed   By: Neita Garnet M.D.   On: 10/26/2021 18:58   Assessment:   1. Exostosis of left foot       Plan:  Patient was evaluated and treated and all questions answered.  Relatively unchanged alignment.  He will continue to be out of work until he is able to undergo surgery.  I discussed with him that I have an opening next week that if he would like to proceed sooner remove move this up he will have to discuss with his wife who is already made plans for his surgery in April as far as her work  schedule.  He will follow-up with me after surgery.   No follow-ups on file.

## 2023-09-19 NOTE — Patient Instructions (Signed)
 The surgery code (CPT code) is [phone]

## 2023-09-20 ENCOUNTER — Telehealth: Payer: Self-pay | Admitting: Podiatry

## 2023-09-20 NOTE — Telephone Encounter (Signed)
 DOS-10/20/23  TARSAL EXOSTECTOMY [id number]  [employer] EFFECTIVE DATE-  07/19/23  SPOKE WITH DEF A FROM [employer] AND SHE STATED THAT PRIOR AUTH IS NOT REQUIRED FOR CPT CODE 86578.   CALL REF #Charline Bills - [id number]

## 2023-09-21 ENCOUNTER — Telehealth: Payer: Self-pay | Admitting: Podiatry

## 2023-09-21 DIAGNOSIS — M898X7 Other specified disorders of bone, ankle and foot: Secondary | ICD-10-CM

## 2023-09-21 NOTE — Telephone Encounter (Signed)
 Faxed [employer] [facility] [employer] forms/notes to [phone].

## 2023-10-20 ENCOUNTER — Other Ambulatory Visit: Payer: Self-pay | Admitting: Podiatry

## 2023-10-20 MED ORDER — ACETAMINOPHEN 500 MG PO TABS: 1000.0000 mg | ORAL_TABLET | Freq: Four times a day (QID) | ORAL | 0 refills | Status: AC | PRN

## 2023-10-20 MED ORDER — OXYCODONE HCL 5 MG PO TABS: 5.0000 mg | ORAL_TABLET | ORAL | 0 refills | Status: AC | PRN

## 2023-10-20 MED ORDER — GABAPENTIN 300 MG PO CAPS: 300.0000 mg | ORAL_CAPSULE | Freq: Three times a day (TID) | ORAL | 0 refills | Status: AC

## 2023-10-20 MED ORDER — IBUPROFEN 600 MG PO TABS: 600.0000 mg | ORAL_TABLET | Freq: Four times a day (QID) | ORAL | 0 refills | Status: AC | PRN

## 2023-10-24 ENCOUNTER — Telehealth: Payer: Self-pay | Admitting: Podiatry

## 2023-10-24 NOTE — Telephone Encounter (Signed)
 Pts fiance called back and I gave her the recommendations from Dr Lilian Kapur and she is going to discuss with pt and will let us know what pt is going to want to do.

## 2023-10-24 NOTE — Telephone Encounter (Signed)
 Pts fiance Shirley chambers left message for a call back.  I returned call and lvm.  Pts fiance called back and she is asking if there is any alternative to having the surgery as the surgery center asked for 2000.00 when pt showed up. They do not have 2000.00 up front. I did explain that it is probably for his deductible and any place he goes if he owes the deductible they will ask for payment.

## 2023-10-24 NOTE — Telephone Encounter (Signed)
 Left message for pts fiance shirley to call me back to discuss what Dr Lilian Kapur said

## 2023-10-26 ENCOUNTER — Encounter: Payer: Medicaid Other | Admitting: Podiatry

## 2023-10-26 ENCOUNTER — Ambulatory Visit: Admitting: Podiatry

## 2023-11-06 ENCOUNTER — Ambulatory Visit (INDEPENDENT_AMBULATORY_CARE_PROVIDER_SITE_OTHER): Admitting: Podiatry

## 2023-11-06 DIAGNOSIS — Z91199 Patient's noncompliance with other medical treatment and regimen due to unspecified reason: Secondary | ICD-10-CM

## 2023-11-07 NOTE — Progress Notes (Signed)
 Patient was no-show for appointment today

## 2023-11-09 ENCOUNTER — Encounter: Payer: Medicaid Other | Admitting: Podiatry

## 2023-11-09 ENCOUNTER — Telehealth: Payer: Self-pay | Admitting: Podiatry

## 2023-11-14 ENCOUNTER — Telehealth: Payer: Self-pay | Admitting: Podiatry

## 2023-11-14 NOTE — Telephone Encounter (Signed)
 DOS: 11/24/23    TARSAL EXOSTECTOMY ([id number]  EFFECTIVE DATE: 10/17/2023 - 10/15/2024     PER [employer] S [employer] NO PRIOR AUTH IS REQ FOR CPT CODE 45409  REF# [id number]

## 2023-11-17 ENCOUNTER — Encounter: Payer: Self-pay | Admitting: Podiatry

## 2023-11-22 ENCOUNTER — Encounter: Payer: Self-pay | Admitting: Podiatry

## 2023-11-22 ENCOUNTER — Telehealth: Payer: Self-pay | Admitting: Podiatry

## 2023-11-22 NOTE — Telephone Encounter (Signed)
 See prev notes Pt req letter of remaining out of work until after new surgery date 11/24/23. Faxed [employer] [facility] [phone] updated letter of RTW 01/15/24 and sent pt a copy via MyChart.

## 2023-11-22 NOTE — Telephone Encounter (Signed)
 Received call from [facility] and pt had left message there last Friday and possibly yesterday.   I called pt and he is needing a letter for being out of work since his surgery was r/s to 5/9. He said he can get it out of my chart but also then stated he needed it sent to the [facility] as well and I transferred to Raquel our FMLA/short term disability specialist.

## 2023-11-23 NOTE — Telephone Encounter (Signed)
 pt lft mess about RTW date sent to the [facility]. I called and adv him yes it was sent to them

## 2023-11-24 ENCOUNTER — Other Ambulatory Visit: Payer: Self-pay | Admitting: Podiatry

## 2023-11-24 DIAGNOSIS — D1632 Benign neoplasm of short bones of left lower limb: Secondary | ICD-10-CM | POA: Diagnosis not present

## 2023-11-24 DIAGNOSIS — M25775 Osteophyte, left foot: Secondary | ICD-10-CM | POA: Diagnosis not present

## 2023-11-24 DIAGNOSIS — M898X7 Other specified disorders of bone, ankle and foot: Secondary | ICD-10-CM | POA: Diagnosis not present

## 2023-11-24 MED ORDER — TRAMADOL HCL 50 MG PO TABS: 50.0000 mg | ORAL_TABLET | Freq: Two times a day (BID) | ORAL | 0 refills | Status: AC | PRN

## 2023-11-24 MED ORDER — IBUPROFEN 800 MG PO TABS: 800.0000 mg | ORAL_TABLET | Freq: Three times a day (TID) | ORAL | 0 refills | Status: AC | PRN

## 2023-11-24 MED ORDER — ACETAMINOPHEN 500 MG PO TABS
1000.0000 mg | ORAL_TABLET | Freq: Four times a day (QID) | ORAL | 0 refills | Status: AC | PRN
Start: 2023-11-24 — End: 2023-12-04

## 2023-11-24 NOTE — Progress Notes (Signed)
 5/9 spur excision

## 2023-11-30 ENCOUNTER — Ambulatory Visit (INDEPENDENT_AMBULATORY_CARE_PROVIDER_SITE_OTHER): Admitting: Podiatry

## 2023-11-30 ENCOUNTER — Encounter: Payer: Self-pay | Admitting: Podiatry

## 2023-11-30 ENCOUNTER — Ambulatory Visit (INDEPENDENT_AMBULATORY_CARE_PROVIDER_SITE_OTHER)

## 2023-11-30 ENCOUNTER — Encounter: Payer: Medicaid Other | Admitting: Podiatry

## 2023-11-30 DIAGNOSIS — S92325D Nondisplaced fracture of second metatarsal bone, left foot, subsequent encounter for fracture with routine healing: Secondary | ICD-10-CM

## 2023-11-30 DIAGNOSIS — M898X7 Other specified disorders of bone, ankle and foot: Secondary | ICD-10-CM

## 2023-11-30 NOTE — Progress Notes (Signed)
  Subjective:  Patient ID: HONDO VANDENHEUVEL, male    DOB: 1980-08-20,  MRN: [id number]  Chief Complaint  Patient presents with   Routine Post Op    POV #1, DOS 11/24/23, REMOVAL BONE SPUR TOP OF LEFT FOOT       43 y.o. male returns for post-op check.   Review of Systems: Negative except as noted in the HPI. Denies N/V/F/Ch.   Objective:  There were no vitals filed for this visit. There is no height or weight on file to calculate BMI. Constitutional Well developed. Well nourished.  Vascular Foot warm and well perfused. Capillary refill normal to all digits.  Calf is soft and supple, no posterior calf or knee pain, negative Homans' sign  Neurologic Normal speech. Oriented to person, place, and time. Epicritic sensation to light touch grossly present bilaterally.  Dermatologic Skin healing well without signs of infection. Skin edges well coapted without signs of infection.  Orthopedic: Tenderness to palpation noted about the surgical site.   Multiple view plain film radiographs: Interval reduction of bony spur Assessment:   1. Exostosis of left foot    Plan:  Patient was evaluated and treated and all questions answered.  S/p foot surgery left -Progressing as expected post-operatively. -XR: Interval spur removal -WB Status: Weightbearing as tolerated in sx shoe -Sutures: Return in 2 weeks to remove. -Medications: No refills required -Foot redressed.  May remove and shower and reapply Ace wrap.  Encouraged ice and elevation to reduce swelling.  No follow-ups on file.

## 2023-12-14 ENCOUNTER — Encounter: Admitting: Podiatry

## 2023-12-21 ENCOUNTER — Encounter: Payer: Self-pay | Admitting: Podiatry

## 2023-12-21 ENCOUNTER — Ambulatory Visit (INDEPENDENT_AMBULATORY_CARE_PROVIDER_SITE_OTHER): Admitting: Podiatry

## 2023-12-21 VITALS — Ht 72.0 in | Wt 285.6 lb

## 2023-12-21 DIAGNOSIS — M898X7 Other specified disorders of bone, ankle and foot: Secondary | ICD-10-CM

## 2023-12-24 NOTE — Progress Notes (Signed)
  Subjective:  Patient ID: Brian Kane, male    DOB: 1980/07/31,  MRN: [id number]  Chief Complaint  Patient presents with   Routine Post Op    POV #2, DOS 11/24/23, REMOVAL BONE SPUR TOP OF LEFT FOOT, pt states no pain but foot is still swollen, no other complaints.        43 y.o. male returns for post-op check.   Review of Systems: Negative except as noted in the HPI. Denies N/V/F/Ch.   Objective:  There were no vitals filed for this visit. Body mass index is 38.73 kg/m. Constitutional Well developed. Well nourished.  Vascular Foot warm and well perfused. Capillary refill normal to all digits.  Calf is soft and supple, no posterior calf or knee pain, negative Homans' sign  Neurologic Normal speech. Oriented to person, place, and time. Epicritic sensation to light touch grossly present bilaterally.  Dermatologic Incision well-healed  Orthopedic: Minimal pain mild edema   Multiple view plain film radiographs: Interval reduction of bony spur Assessment:   1. Exostosis of left foot    Plan:  Patient was evaluated and treated and all questions answered.  S/p foot surgery left - Continue icing.  Compression sleeve applied.  Return to regular shoe gear and may return to work as scheduled.  Follow-up as needed.  Return if symptoms worsen or fail to improve.

## 2024-01-03 ENCOUNTER — Encounter: Payer: Self-pay | Admitting: Podiatry

## 2024-01-04 ENCOUNTER — Encounter: Admitting: Podiatry

## 2024-01-04 ENCOUNTER — Encounter: Payer: Self-pay | Admitting: Podiatry

## 2024-01-08 ENCOUNTER — Encounter: Payer: Self-pay | Admitting: Podiatry

## 2024-02-14 ENCOUNTER — Telehealth: Payer: Self-pay | Admitting: Podiatry

## 2024-02-14 DIAGNOSIS — Z0271 Encounter for disability determination: Secondary | ICD-10-CM

## 2024-02-14 NOTE — Telephone Encounter (Signed)
 pt lft mess on my vmail and I called back. adv new form recd. He wants intermittenet and dr visits 01/28/24-07/17/24 2-4 days per week 12hrs and 1 office visit per mth 1 hour. will fax [phone] and email him

## 2024-02-21 ENCOUNTER — Telehealth: Payer: Self-pay | Admitting: Podiatry

## 2024-02-21 NOTE — Telephone Encounter (Signed)
 pt emailed me to call him. Called him at [phone] and mailbox is full. I will email him bck to adv tried calling

## 2024-02-27 ENCOUNTER — Ambulatory Visit: Admitting: Podiatry

## 2024-02-28 ENCOUNTER — Ambulatory Visit (INDEPENDENT_AMBULATORY_CARE_PROVIDER_SITE_OTHER): Admitting: Podiatry

## 2024-02-28 ENCOUNTER — Ambulatory Visit

## 2024-02-28 VITALS — Ht 72.0 in | Wt 285.6 lb

## 2024-02-28 DIAGNOSIS — M1A0720 Idiopathic chronic gout, left ankle and foot, without tophus (tophi): Secondary | ICD-10-CM | POA: Diagnosis not present

## 2024-02-28 DIAGNOSIS — M898X7 Other specified disorders of bone, ankle and foot: Secondary | ICD-10-CM | POA: Diagnosis not present

## 2024-02-28 NOTE — Progress Notes (Signed)
  Subjective:  Patient ID: Brian Kane, male    DOB: 08-May-1981,  MRN: [id number]  Chief Complaint  Patient presents with   exostosis    RM 1 Patient is here to follow-up on exostosis of the left foot. Patient states intermittent swelling and moderate pain in the top of the left foot. Pain described as a dull ache.       43 y.o. male returns for post-op check.  Overall about 80% improved.  Thinks he may be having intermittent gout flares as well.  Review of Systems: Negative except as noted in the HPI. Denies N/V/F/Ch.   Objective:  There were no vitals filed for this visit. Body mass index is 38.73 kg/m. Constitutional Well developed. Well nourished.  Vascular Foot warm and well perfused. Capillary refill normal to all digits.  Calf is soft and supple, no posterior calf or knee pain, negative Homans' sign  Neurologic Normal speech. Oriented to person, place, and time. Epicritic sensation to light touch grossly present bilaterally.  Dermatologic Incision well-healed  Orthopedic: Minimal pain mild edema.  Some tenderness over incision site with palpation.  No active swelling or edema   Multiple view plain film radiographs: Interval reduction of bony spur, no changes since last x-rays Assessment:   1. Exostosis of left foot   2. Idiopathic chronic gout of left foot without tophus    Plan:  Patient was evaluated and treated and all questions answered.  S/p foot surgery left - New x-rays taken today there is no interval change or regrowth of the bone spur.  His edema has improved.  Most of his discomfort likely related to scar tissue we discussed scar tissue massage and if not improving in 3 months would recommend corticosteroid injection along the scar and incision.  He also may be having intermittent gout flares he has a history of this, not currently in a flare I placed an order for uric acid to be obtained for him when he develops a new flare he may walk-in at [facility]  to have this checked when it happens.  Will notify him of results via MyChart once resulted  Return in about 3 months (around 05/30/2024) for f/u left foot sx (new xrays).

## 2024-05-20 ENCOUNTER — Other Ambulatory Visit: Payer: Self-pay | Admitting: Podiatry

## 2024-06-21 ENCOUNTER — Ambulatory Visit: Admitting: Family Medicine

## 2024-06-21 ENCOUNTER — Encounter: Payer: Self-pay | Admitting: Family Medicine

## 2024-06-21 VITALS — BP 132/80 | HR 83 | Temp 98.0°F | Ht 72.0 in | Wt 296.2 lb

## 2024-06-21 DIAGNOSIS — Z720 Tobacco use: Secondary | ICD-10-CM

## 2024-06-21 DIAGNOSIS — E049 Nontoxic goiter, unspecified: Secondary | ICD-10-CM | POA: Diagnosis not present

## 2024-06-21 DIAGNOSIS — M1A0720 Idiopathic chronic gout, left ankle and foot, without tophus (tophi): Secondary | ICD-10-CM

## 2024-06-21 DIAGNOSIS — I10 Essential (primary) hypertension: Secondary | ICD-10-CM | POA: Diagnosis not present

## 2024-06-21 DIAGNOSIS — F1090 Alcohol use, unspecified, uncomplicated: Secondary | ICD-10-CM | POA: Diagnosis not present

## 2024-06-21 LAB — URIC ACID: Uric Acid, Serum: 7.2 mg/dL (ref 4.0–7.8)

## 2024-06-21 LAB — CBC WITH DIFFERENTIAL/PLATELET
Basophils Absolute: 0 K/uL (ref 0.0–0.1)
Basophils Relative: 0.4 % (ref 0.0–3.0)
Eosinophils Absolute: 0.1 K/uL (ref 0.0–0.7)
Eosinophils Relative: 0.9 % (ref 0.0–5.0)
HCT: 46.7 % (ref 39.0–52.0)
Hemoglobin: 15.9 g/dL (ref 13.0–17.0)
Lymphocytes Relative: 40.6 % (ref 12.0–46.0)
Lymphs Abs: 3 K/uL (ref 0.7–4.0)
MCHC: 33.9 g/dL (ref 30.0–36.0)
MCV: 92.7 fl (ref 78.0–100.0)
Monocytes Absolute: 0.7 K/uL (ref 0.1–1.0)
Monocytes Relative: 9.2 % (ref 3.0–12.0)
Neutro Abs: 3.6 K/uL (ref 1.4–7.7)
Neutrophils Relative %: 48.9 % (ref 43.0–77.0)
Platelets: 274 K/uL (ref 150.0–400.0)
RBC: 5.04 Mil/uL (ref 4.22–5.81)
RDW: 14.6 % (ref 11.5–15.5)
WBC: 7.3 K/uL (ref 4.0–10.5)

## 2024-06-21 LAB — HEPATIC FUNCTION PANEL
ALT: 31 U/L (ref 0–53)
AST: 25 U/L (ref 0–37)
Albumin: 4.2 g/dL (ref 3.5–5.2)
Alkaline Phosphatase: 68 U/L (ref 39–117)
Bilirubin, Direct: 0.1 mg/dL (ref 0.0–0.3)
Total Bilirubin: 0.3 mg/dL (ref 0.2–1.2)
Total Protein: 7.2 g/dL (ref 6.0–8.3)

## 2024-06-21 LAB — BASIC METABOLIC PANEL WITH GFR
BUN: 20 mg/dL (ref 6–23)
CO2: 26 meq/L (ref 19–32)
Calcium: 8.9 mg/dL (ref 8.4–10.5)
Chloride: 103 meq/L (ref 96–112)
Creatinine, Ser: 0.96 mg/dL (ref 0.40–1.50)
GFR: 96.36 mL/min (ref >60.00)
Glucose, Bld: 99 mg/dL (ref 70–99)
Potassium: 4 meq/L (ref 3.5–5.1)
Sodium: 137 meq/L (ref 135–145)

## 2024-06-21 LAB — TSH: TSH: 1.34 u[IU]/mL (ref 0.35–5.50)

## 2024-06-21 MED ORDER — COLCHICINE 0.6 MG PO TABS: 0.6000 mg | ORAL_TABLET | Freq: Every day | ORAL | 0 refills | Status: AC

## 2024-06-21 MED ORDER — ALLOPURINOL 300 MG PO TABS: 300.0000 mg | ORAL_TABLET | Freq: Every day | ORAL | 1 refills | Status: AC

## 2024-06-21 NOTE — Progress Notes (Signed)
 Established Patient Office Visit   Subjective:  Patient ID: Brian Kane, male    DOB: 1980/08/12  Age: 43 y.o. MRN: [id number]  Chief Complaint  Patient presents with   Alcohol  Problem    Something to help stop drinking want to seek  counseling    Alcohol  Problem Pertinent negatives include no loss of consciousness or weakness.   Encounter Diagnoses  Name Primary?   Essential hypertension Yes   Chronic gout of left foot, unspecified cause    Alcohol  use disorder    Tobacco use    Thyroid  goiter    For follow-up of above.  His wife has been concerned also about his alcohol  use and scheduled this appointment for him.  Continues amlodipine  5 mg for his hypertension.  Was seen in December of this past year and started on allopurinol  with colchicine  and was asked to follow-up in 3 months.  He has been out of colchicine  and has been taking the allopurinol  as needed.  His wife had accompanied him at his last visit and we discussed safe alcohol  usage.  He works 60 hours a week.  He works nights.  He has 3 to 4 days off per month.  On the days that he has off he drinks heavily.  He starts at 7 in the morning which is the equivalent of 5 PM for people who work a day schedule.   Review of Systems  Constitutional: Negative.   HENT: Negative.    Eyes:  Negative for blurred vision, discharge and redness.  Respiratory: Negative.    Cardiovascular: Negative.   Gastrointestinal:  Negative for abdominal pain.  Genitourinary: Negative.   Musculoskeletal: Negative.  Negative for myalgias.  Skin:  Negative for rash.  Neurological:  Negative for tingling, loss of consciousness and weakness.  Endo/Heme/Allergies:  Negative for polydipsia.     Current Outpatient Medications:    amLODipine  (NORVASC ) 5 MG tablet, Take 1 tablet (5 mg total) by mouth daily., Disp: 90 tablet, Rfl: 1   colchicine  0.6 MG tablet, Take 1 tablet (0.6 mg total) by mouth daily., Disp: 90 tablet, Rfl: 0    colchicine  0.6 MG tablet, Take 1 tablet (0.6 mg total) by mouth daily., Disp: 90 tablet, Rfl: 0   diclofenac  (VOLTAREN ) 75 MG EC tablet, Take 1 tablet (75 mg total) by mouth 2 (two) times daily., Disp: 50 tablet, Rfl: 2   levothyroxine  (SYNTHROID ) 25 MCG tablet, Take 1 tablet (25 mcg total) by mouth daily., Disp: 90 tablet, Rfl: 3   allopurinol  (ZYLOPRIM ) 300 MG tablet, Take 1 tablet (300 mg total) by mouth daily., Disp: 90 tablet, Rfl: 1   gabapentin  (NEURONTIN ) 300 MG capsule, Take 1 capsule (300 mg total) by mouth 3 (three) times daily for 7 days. (Patient not taking: Reported on 06/21/2024), Disp: 21 capsule, Rfl: 0   Objective:     BP 132/80   Pulse 83   Temp 98 F (36.7 C) (Temporal)   Ht 6' (1.829 m)   Wt 296 lb 3.2 oz (134.4 kg)   SpO2 99%   BMI 40.17 kg/m  BP Readings from Last 3 Encounters:  06/21/24 132/80  07/18/23 (!) 144/98  07/06/23 124/84   Wt Readings from Last 3 Encounters:  06/21/24 296 lb 3.2 oz (134.4 kg)  02/28/24 285 lb 9.6 oz (129.5 kg)  12/21/23 285 lb 9.6 oz (129.5 kg)      Physical Exam Constitutional:      General: He is not in acute distress.  Appearance: Normal appearance. He is not ill-appearing, toxic-appearing or diaphoretic.  HENT:     Head: Normocephalic and atraumatic.     Right Ear: External ear normal.     Left Ear: External ear normal.     Mouth/Throat:     Mouth: Mucous membranes are moist.     Pharynx: Oropharynx is clear. No oropharyngeal exudate or posterior oropharyngeal erythema.  Eyes:     General: No scleral icterus.       Right eye: No discharge.        Left eye: No discharge.     Extraocular Movements: Extraocular movements intact.     Conjunctiva/sclera: Conjunctivae normal.     Pupils: Pupils are equal, round, and reactive to light.  Neck:     Thyroid : Thyromegaly present.  Cardiovascular:     Rate and Rhythm: Normal rate and regular rhythm.  Pulmonary:     Effort: Pulmonary effort is normal. No respiratory  distress.     Breath sounds: Normal breath sounds. No wheezing or rales.  Musculoskeletal:     Cervical back: No rigidity or tenderness.  Lymphadenopathy:     Cervical: No cervical adenopathy.  Skin:    General: Skin is warm and dry.  Neurological:     Mental Status: He is alert and oriented to person, place, and time.  Psychiatric:        Mood and Affect: Mood normal.        Behavior: Behavior normal.      No results found for any visits on 06/21/24.    The 10-year ASCVD risk score (Arnett DK, et al., 2019) is: 13%    Assessment & Plan:   Essential hypertension -     Basic metabolic panel with GFR -     CBC with Differential/Platelet  Chronic gout of left foot, unspecified cause -     Allopurinol ; Take 1 tablet (300 mg total) by mouth daily.  Dispense: 90 tablet; Refill: 1 -     Basic metabolic panel with GFR -     Hepatic function panel -     Uric acid -     Colchicine ; Take 1 tablet (0.6 mg total) by mouth daily.  Dispense: 90 tablet; Refill: 0  Alcohol  use disorder -     Basic metabolic panel with GFR -     CBC with Differential/Platelet -     Phosphatidylethanol (PEth)  Tobacco use  Thyroid  goiter -     TSH    Return in about 6 weeks (around 08/02/2024), or Call [facility] at [phone] to discuss treatment options..  We discussed his drinking pattern and has been better binge drinking.  Again we discussed that safe drinking would be no more than 2 servings of alcohol  in a given 24-hour..  I believe that he could be a candidate for the outpatient treatment program through [facility] and he was supplied with their phone number.  Information was given on gout, manage and hypertension and managing the challenges of quitting smoking.  Elsie Sim Lent, MD

## 2024-06-24 ENCOUNTER — Ambulatory Visit: Admitting: Family Medicine

## 2024-07-02 ENCOUNTER — Ambulatory Visit: Payer: Self-pay | Admitting: Family Medicine

## 2024-07-02 LAB — PHOSPHATIDYLETHANOL (PETH)
Phosphatidylethanol (PEth): 554 ng/mL
Phosphatidylethanol: POSITIVE — AB

## 2024-07-24 ENCOUNTER — Other Ambulatory Visit: Payer: Self-pay | Admitting: Podiatry

## 2024-07-24 ENCOUNTER — Other Ambulatory Visit: Payer: Self-pay | Admitting: Internal Medicine

## 2024-07-24 DIAGNOSIS — M79672 Pain in left foot: Secondary | ICD-10-CM
